# Patient Record
Sex: Female | Born: 1990 | Race: Black or African American | Hispanic: No | Marital: Married | Smoking: Never smoker
Health system: Southern US, Community
[De-identification: ages and names within clinical notes are randomized; demographics above are authoritative.]

## PROBLEM LIST (undated history)

## (undated) DIAGNOSIS — R519 Headache, unspecified: Secondary | ICD-10-CM

## (undated) HISTORY — DX: Headache, unspecified: R51.9

## (undated) HISTORY — PX: NO PAST SURGERIES: SHX2092

---

## 2017-03-25 ENCOUNTER — Encounter (HOSPITAL_COMMUNITY): Payer: Self-pay | Admitting: Emergency Medicine

## 2017-03-25 ENCOUNTER — Emergency Department (HOSPITAL_COMMUNITY)
Admission: EM | Admit: 2017-03-25 | Discharge: 2017-03-25 | Disposition: A | Discharge status: Home / Self Care | Payer: Self-pay | Attending: Emergency Medicine | Admitting: Emergency Medicine

## 2017-03-25 DIAGNOSIS — H5711 Ocular pain, right eye: Secondary | ICD-10-CM | POA: Insufficient documentation

## 2017-03-25 MED ORDER — FLUORESCEIN SODIUM 0.6 MG OP STRP
1.0000 | ORAL_STRIP | Freq: Once | OPHTHALMIC | Status: AC
  Administered 2017-03-25: 1 via OPHTHALMIC
  Filled 2017-03-25: qty 1

## 2017-03-25 MED ORDER — MORPHINE SULFATE (PF) 4 MG/ML IV SOLN: 4.0000 mg | Freq: Once | INTRAVENOUS | Status: DC

## 2017-03-25 MED ORDER — TETRACAINE HCL 0.5 % OP SOLN
2.0000 [drp] | Freq: Once | OPHTHALMIC | Status: AC
  Administered 2017-03-25: 2 [drp] via OPHTHALMIC
  Filled 2017-03-25: qty 8

## 2017-03-25 MED ORDER — IBUPROFEN 400 MG PO TABS
600.0000 mg | ORAL_TABLET | Freq: Once | ORAL | Status: AC
  Administered 2017-03-25: 11:00:00 600 mg via ORAL
  Filled 2017-03-25: qty 1

## 2017-03-25 NOTE — ED Provider Notes (Signed)
Medical screening examination/treatment/procedure(s) were conducted as a shared visit with non-physician practitioner(s) and myself.  I personally evaluated the patient during the encounter. Briefly, the patient is a 26 y.o. female eye pain. Presentation was concerning for uveitis. Discusses with Optho who will see her in the office immediately following ED DC for further evaluation and management.    EKG Interpretation None           Elgin Carn, Amadeo GarnetPedro Eduardo, MD 03/25/17 1108

## 2017-03-25 NOTE — Discharge Instructions (Signed)
Please go to the eye doctor's office when you leave the ED. Have given you the address and phone number.

## 2017-03-25 NOTE — ED Triage Notes (Signed)
Pt c/o 10/10 bilateral eye pain right side more than left, pt states she has allergies and she took benadryl last night but this morning she felt like stones on her eyes. Having blurred vision.

## 2017-03-25 NOTE — ED Provider Notes (Signed)
[facility]-EMERGENCY DEPT Provider Note   CSN: [id number] Arrival date & time: 03/25/17  0544     History   Chief Complaint Chief Complaint  Patient presents with  . Eye Pain    HPI Shelby Orr is a 26 y.o. female.  HPI 26 year old African-American female with no significant past medical history presents to the ED today with complaints of right eye pain and redness. Patient states that 4 days ago she started itching her right eye and thought it was allergies. She noticed some crusting when she woke up in the mornings. She states that she has been taking some Benadryl to help with the itching. However this morning when she woke up she had severe pain in her right eye. States that the lights bother her eyes. She also reports some pain in the left eye as well. Reports blurred vision. She tried taking a Benadryl this morning but had no relief. Light makes the pain worse. Nothing makes the pain better.patient states that "it feels like stones are in my eyes". Denies any associated symptoms of fever, chills, headache, lightheadedness, dizziness, URI symptoms, recent travel. History reviewed. No pertinent past medical history.  There are no active problems to display for this patient.   History reviewed. No pertinent surgical history.  OB History    No data available       Home Medications    Prior to Admission medications   Not on File    Family History History reviewed. No pertinent family history.  Social History Social History  Substance Use Topics  . Smoking status: Never Smoker  . Smokeless tobacco: Never Used  . Alcohol use No     Allergies   Patient has no known allergies.   Review of Systems Review of Systems  Constitutional: Negative for chills and fever.  HENT: Negative for congestion and sore throat.   Eyes: Positive for photophobia, pain, discharge, redness, itching and visual disturbance.  Gastrointestinal: Negative for nausea and vomiting.    Neurological: Negative for dizziness, syncope, weakness, light-headedness, numbness and headaches.     Physical Exam Updated Vital Signs BP (!) 139/104 (BP Location: Left Arm)   Pulse 74   Temp 97.9 F (36.6 C) (Oral)   Resp 18   Ht 5\' 5"  (1.651 m)   Wt 65.8 kg (145 lb)   LMP 03/21/2017   SpO2 100%   BMI 24.13 kg/m   Physical Exam  Constitutional: She appears well-developed and well-nourished. No distress.  HENT:  Head: Normocephalic and atraumatic.  Eyes: Pupils are equal, round, and reactive to light. EOM are normal. Lids are everted and swept, no foreign bodies found. Right eye exhibits no discharge. Left eye exhibits no discharge. Right conjunctiva is injected. Right conjunctiva has no hemorrhage. No scleral icterus.  Fundoscopic exam:      The right eye shows no papilledema.  Slit lamp exam:      The right eye shows no corneal flare.  Consensual photophobia noted. Pupils are equal round and reactive to light. EOMs are intact. No associated periorbital swelling or erythema. No proptosis. Unable to obtain an accurate visual acuity due to patient's pain. Reports blurry vision.  Patient with fluorescent uptake in the cornea with likely associated corneal abrasion. No foreign body noted. Lids were inverted without any foreign body. Leukocytes present on slit-lamp exam. No hyphema.  Left eye is normal.  Neck: Normal range of motion.  Pulmonary/Chest: No respiratory distress.  Musculoskeletal: Normal range of motion.  Neurological: She is alert.  Skin: No pallor.  Psychiatric: Her behavior is normal. Judgment and thought content normal.  Nursing note and vitals reviewed.    ED Treatments / Results  Labs (all labs ordered are listed, but only abnormal results are displayed) Labs Reviewed - No data to display  EKG  EKG Interpretation None       Radiology No results found.  Procedures Procedures (including critical care time)  Medications Ordered in  ED Medications - No data to display   Initial Impression / Assessment and Plan / ED Course  I have reviewed the triage vital signs and the nursing notes.  Pertinent labs & imaging results that were available during my care of the patient were reviewed by me and considered in my medical decision making (see chart for details).     Patient presents to the ED with right eye pain. Unable to obtain accurate visual acuity due to pain. EOMs are intact. Pupils are round equal and reactive to light. Patient does have consensual photophobia. Would slip reveals fluorescein uptake concerning for corneal abrasion on the right cornea. Slit lamp does reveal what appears to be leukocytes. Concern for possible uveitis. Given normal EOMs and no proptosis without any erythema or edema of the surrounding orbits low suspicion for septal or orbital cellulitis. Patient denies any associated symptoms of fever or chills.  Spoke with Dr. Genia DelMincey with ophthalmology who recommends the patient come to their office after discharge from the ED for evaluation. Denies any recommendations at this time.   Pt is hemodynamically stable, in NAD, & able to ambulate in the ED. Evaluation does not show pathology that would require ongoing emergent intervention or inpatient treatment. I explained the diagnosis to the patient. Pain has been managed & has no complaints prior to dc. Pt is comfortable with above plan and is stable for discharge at this time. All questions were answered prior to disposition. Strict return precautions for f/u to the ED were discussed.   Dicussed with Dr. Eudelia Bunchardama who is agreeable to the above plan.   Final Clinical Impressions(s) / ED Diagnoses   Final diagnoses:  Acute right eye pain    New Prescriptions New Prescriptions   No medications on file     Wallace KellerLeaphart, Aaryav Hopfensperger T, PA-C 03/25/17 1118

## 2017-03-25 NOTE — ED Notes (Signed)
Pt pain began about 4 days ago. Pt stated eye was pus/fluid draining yesterday. Today pt cannot open eye. Pt stated that it feels like something is inside or behind eye. Pt is denies any other pain/concernes.

## 2019-04-14 ENCOUNTER — Ambulatory Visit: Payer: Self-pay

## 2019-04-14 ENCOUNTER — Other Ambulatory Visit: Payer: Self-pay

## 2019-04-14 DIAGNOSIS — N912 Amenorrhea, unspecified: Secondary | ICD-10-CM

## 2019-04-14 DIAGNOSIS — Z3201 Encounter for pregnancy test, result positive: Secondary | ICD-10-CM

## 2019-04-14 LAB — POCT URINE PREGNANCY: Preg Test, Ur: POSITIVE — AB

## 2019-04-14 NOTE — Progress Notes (Signed)
Shelby Orr presents today for UPT. She has no unusual complaints and complains of nausea 1 week.Marland Kitchen LMP: 02/12/19    OBJECTIVE: Appears well, in no apparent distress.  OB History   No obstetric history on file.    Home UPT Result: Positive In-Office UPT result: Positive I have reviewed the patient's medical, obstetrical, social, and family histories, and medications.   ASSESSMENT: Positive pregnancy test. Pt is [redacted]w[redacted]d estimated by LMP. EDD 11/19/19  PLAN Prenatal care to be completed at: [place]

## 2019-06-10 ENCOUNTER — Encounter: Payer: Self-pay | Admitting: Obstetrics

## 2019-06-10 DIAGNOSIS — Z3490 Encounter for supervision of normal pregnancy, unspecified, unspecified trimester: Secondary | ICD-10-CM | POA: Insufficient documentation

## 2019-06-11 ENCOUNTER — Ambulatory Visit (INDEPENDENT_AMBULATORY_CARE_PROVIDER_SITE_OTHER): Payer: Medicaid Other | Admitting: Obstetrics

## 2019-06-11 ENCOUNTER — Other Ambulatory Visit: Payer: Self-pay

## 2019-06-11 ENCOUNTER — Other Ambulatory Visit (HOSPITAL_COMMUNITY)
Admission: RE | Admit: 2019-06-11 | Discharge: 2019-06-11 | Disposition: A | Discharge status: Home / Self Care | Payer: Medicaid Other | Source: Ambulatory Visit | Attending: Obstetrics | Admitting: Obstetrics

## 2019-06-11 ENCOUNTER — Encounter: Payer: Self-pay | Admitting: Obstetrics

## 2019-06-11 VITALS — BP 87/56 | HR 60 | Temp 98.2°F | Wt 123.0 lb

## 2019-06-11 DIAGNOSIS — Z3480 Encounter for supervision of other normal pregnancy, unspecified trimester: Secondary | ICD-10-CM

## 2019-06-11 DIAGNOSIS — Z3A17 17 weeks gestation of pregnancy: Secondary | ICD-10-CM

## 2019-06-11 DIAGNOSIS — Z3482 Encounter for supervision of other normal pregnancy, second trimester: Secondary | ICD-10-CM

## 2019-06-11 MED ORDER — BLOOD PRESSURE KIT DEVI: 1.0000 | 0 refills | Status: DC

## 2019-06-11 NOTE — Progress Notes (Addendum)
Subjective:    Shelby Orr is being seen today for her first obstetrical visit.  This is a planned pregnancy. She is at 23w0dgestation. Her obstetrical history is significant for none. Relationship with FOB: spouse, living together. Patient does intend to breast feed. Pregnancy history fully reviewed.  The information documented in the HPI was reviewed and verified.  Menstrual History: OB History    Gravida  4   Para  3   Term  3   Preterm      AB      Living  3     SAB      TAB      Ectopic      Multiple      Live Births  3            Patient's last menstrual period was 02/12/2019.    Past Medical History:  Diagnosis Date  . Headache     History reviewed. No pertinent surgical history.  (Not in a hospital admission)  No Known Allergies  Social History   Tobacco Use  . Smoking status: Never Smoker  . Smokeless tobacco: Never Used  Substance Use Topics  . Alcohol use: No    Family History  Problem Relation Age of Onset  . Stroke Mother   . Hypertension Father   . Diabetes Paternal Grandfather      Review of Systems Constitutional: negative for weight loss Gastrointestinal: negative for vomiting Genitourinary:negative for genital lesions and vaginal discharge and dysuria Musculoskeletal:negative for back pain Behavioral/Psych: negative for abusive relationship, depression, illegal drug usage and tobacco use    Objective:    BP (!) 87/56   Pulse 60   Temp 98.2 F (36.8 C)   Wt 123 lb (55.8 kg)   LMP 02/12/2019   BMI 20.47 kg/m  General Appearance:    Alert, cooperative, no distress, appears stated age  Head:    Normocephalic, without obvious abnormality, atraumatic  Eyes:    PERRL, conjunctiva/corneas clear, EOM's intact, fundi    benign, both eyes  Ears:    Normal TM's and external ear canals, both ears  Nose:   Nares normal, septum midline, mucosa normal, no drainage    or sinus tenderness  Throat:   Lips, mucosa, and tongue  normal; teeth and gums normal  Neck:   Supple, symmetrical, trachea midline, no adenopathy;    thyroid:  no enlargement/tenderness/nodules; no carotid   bruit or JVD  Back:     Symmetric, no curvature, ROM normal, no CVA tenderness  Lungs:     Clear to auscultation bilaterally, respirations unlabored  Chest Wall:    No tenderness or deformity   Heart:    Regular rate and rhythm, S1 and S2 normal, no murmur, rub   or gallop  Breast Exam:    No tenderness, masses, or nipple abnormality  Abdomen:     Soft, non-tender, bowel sounds active all four quadrants,    no masses, no organomegaly  Genitalia:    Normal female without lesion, discharge or tenderness  Extremities:   Extremities normal, atraumatic, no cyanosis or edema  Pulses:   2+ and symmetric all extremities  Skin:   Skin color, texture, turgor normal, no rashes or lesions  Lymph nodes:   Cervical, supraclavicular, and axillary nodes normal  Neurologic:   CNII-XII intact, normal strength, sensation and reflexes    throughout      Lab Review Urine pregnancy test Labs reviewed yes Radiologic studies reviewed yes  Assessment:    Pregnancy at 73w0dweeks    Plan:     1. Supervision of other normal pregnancy, antepartum Rx: - Blood Pressure Monitoring (BLOOD PRESSURE KIT) DEVI; 1 kit by Does not apply route once a week. Check BP regularly.  Large Cuff  DX: O90.0  Dispense: 1 kit; Refill: 0 - Cytology - PAP( [place]) - Cervicovaginal ancillary only( [place]) - Enroll Patient in Babyscripts - Babyscripts Schedule Optimization - Obstetric Panel, Including HIV - Culture, OB Urine - Genetic Screening - UKoreaMFM OB COMP + 190WK; Future  Prenatal vitamins.  Counseling provided regarding continued use of seat belts, cessation of alcohol consumption, smoking or use of illicit drugs; infection precautions i.e., influenza/TDAP immunizations, toxoplasmosis,CMV, parvovirus, listeria and varicella; workplace safety, exercise during  pregnancy; routine dental care, safe medications, sexual activity, hot tubs, saunas, pools, travel, caffeine use, fish and methlymercury, potential toxins, hair treatments, varicose veins Weight gain recommendations per IOM guidelines reviewed: underweight/BMI< 18.5--> gain 28 - 40 lbs; normal weight/BMI 18.5 - 24.9--> gain 25 - 35 lbs; overweight/BMI 25 - 29.9--> gain 15 - 25 lbs; obese/BMI >30->gain  11 - 20 lbs Problem list reviewed and updated. FIRST/CF mutation testing/NIPT/QUAD SCREEN/fragile X/Ashkenazi Jewish population testing/Spinal muscular atrophy discussed: requested. Role of ultrasound in pregnancy discussed; fetal survey: requested. Amniocentesis discussed: not indicated.  Meds ordered this encounter  Medications  . Blood Pressure Monitoring (BLOOD PRESSURE KIT) DEVI    Sig: 1 kit by Does not apply route once a week. Check BP regularly.  Large Cuff  DX: O90.0    Dispense:  1 kit    Refill:  0   Orders Placed This Encounter  Procedures  . Culture, OB Urine  . UKoreaMFM OB COMP + 14 WK    Standing Status:   Future    Standing Expiration Date:   08/10/2020    Order Specific Question:   Reason for Exam (SYMPTOM  OR DIAGNOSIS REQUIRED)    Answer:   Anatomy    Order Specific Question:   Preferred Location    Answer:   [facility] . Obstetric Panel, Including HIV  . Genetic Screening    Follow up in 4 weeks.  50% of 25 min visit spent on counseling and coordination of care. New OB.  Declined FLU vaccine. C/o pain in vagina.  HShelly Bombard MD 06/11/2019 10:40 AM

## 2019-06-12 LAB — OBSTETRIC PANEL, INCLUDING HIV
Antibody Screen: NEGATIVE
Basophils Absolute: 0 10*3/uL (ref 0.0–0.2)
Basos: 0 %
EOS (ABSOLUTE): 0.1 10*3/uL (ref 0.0–0.4)
Eos: 1 %
HIV Screen 4th Generation wRfx: NONREACTIVE
Hematocrit: 32.3 % — ABNORMAL LOW (ref 34.0–46.6)
Hemoglobin: 10.4 g/dL — ABNORMAL LOW (ref 11.1–15.9)
Hepatitis B Surface Ag: NEGATIVE
Immature Grans (Abs): 0 10*3/uL (ref 0.0–0.1)
Immature Granulocytes: 0 %
Lymphocytes Absolute: 2.2 10*3/uL (ref 0.7–3.1)
Lymphs: 23 %
MCH: 28.7 pg (ref 26.6–33.0)
MCHC: 32.2 g/dL (ref 31.5–35.7)
MCV: 89 fL (ref 79–97)
Monocytes Absolute: 0.5 10*3/uL (ref 0.1–0.9)
Monocytes: 5 %
Neutrophils Absolute: 6.6 10*3/uL (ref 1.4–7.0)
Neutrophils: 71 %
Platelets: 275 10*3/uL (ref 150–450)
RBC: 3.63 x10E6/uL — ABNORMAL LOW (ref 3.77–5.28)
RDW: 11.6 % — ABNORMAL LOW (ref 11.7–15.4)
RPR Ser Ql: NONREACTIVE
Rh Factor: POSITIVE
Rubella Antibodies, IGG: 16.5 index (ref >0.99)
WBC: 9.5 10*3/uL (ref 3.4–10.8)

## 2019-06-13 LAB — CULTURE, OB URINE

## 2019-06-13 LAB — URINE CULTURE, OB REFLEX

## 2019-06-14 ENCOUNTER — Other Ambulatory Visit: Payer: Self-pay | Admitting: Obstetrics

## 2019-06-14 DIAGNOSIS — B9689 Other specified bacterial agents as the cause of diseases classified elsewhere: Secondary | ICD-10-CM

## 2019-06-14 DIAGNOSIS — N760 Acute vaginitis: Secondary | ICD-10-CM

## 2019-06-14 LAB — CERVICOVAGINAL ANCILLARY ONLY
Bacterial Vaginitis (gardnerella): POSITIVE — AB
Candida Glabrata: NEGATIVE
Candida Vaginitis: NEGATIVE
Chlamydia: NEGATIVE
Comment: NEGATIVE
Comment: NEGATIVE
Comment: NEGATIVE
Comment: NEGATIVE
Comment: NEGATIVE
Comment: NORMAL
Neisseria Gonorrhea: NEGATIVE
Trichomonas: NEGATIVE

## 2019-06-14 MED ORDER — TINIDAZOLE 500 MG PO TABS: 1000.0000 mg | ORAL_TABLET | Freq: Every day | ORAL | 2 refills | Status: DC

## 2019-06-15 ENCOUNTER — Telehealth: Payer: Self-pay

## 2019-06-15 NOTE — Telephone Encounter (Signed)
-----   Message from Shelly Bombard, MD sent at 06/14/2019  3:48 PM EST ----- Tindamax Rx for BV

## 2019-06-15 NOTE — Telephone Encounter (Signed)
Patient notified of Rx and results.

## 2019-06-16 LAB — CYTOLOGY - PAP
Comment: NEGATIVE
Diagnosis: NEGATIVE
Diagnosis: REACTIVE
High risk HPV: NEGATIVE

## 2019-06-21 MED ORDER — BLOOD PRESSURE KIT DEVI: 1.0000 | 0 refills | Status: DC

## 2019-06-21 NOTE — Addendum Note (Signed)
Addended by: Tamela Oddi on: 06/21/2019 01:30 PM   Modules accepted: Orders

## 2019-06-22 ENCOUNTER — Encounter: Payer: Self-pay | Admitting: Obstetrics

## 2019-06-25 ENCOUNTER — Ambulatory Visit (HOSPITAL_COMMUNITY)
Admission: RE | Admit: 2019-06-25 | Discharge: 2019-06-25 | Disposition: A | Discharge status: Home / Self Care | Payer: Medicaid Other | Source: Ambulatory Visit | Attending: Obstetrics | Admitting: Obstetrics

## 2019-06-25 ENCOUNTER — Other Ambulatory Visit: Payer: Self-pay

## 2019-06-25 DIAGNOSIS — Z3480 Encounter for supervision of other normal pregnancy, unspecified trimester: Secondary | ICD-10-CM | POA: Insufficient documentation

## 2019-06-25 DIAGNOSIS — Z363 Encounter for antenatal screening for malformations: Secondary | ICD-10-CM

## 2019-06-25 DIAGNOSIS — Z3A19 19 weeks gestation of pregnancy: Secondary | ICD-10-CM | POA: Diagnosis not present

## 2019-07-07 ENCOUNTER — Other Ambulatory Visit: Payer: Self-pay | Admitting: *Deleted

## 2019-07-07 MED ORDER — METRONIDAZOLE 500 MG PO TABS: 500.0000 mg | ORAL_TABLET | Freq: Two times a day (BID) | ORAL | 0 refills | Status: DC

## 2019-07-07 NOTE — Progress Notes (Signed)
Change in Rx to Flagyl for ins coverage.

## 2019-07-12 ENCOUNTER — Encounter: Payer: Self-pay | Admitting: Obstetrics

## 2019-07-12 ENCOUNTER — Ambulatory Visit (INDEPENDENT_AMBULATORY_CARE_PROVIDER_SITE_OTHER): Payer: Medicaid Other | Admitting: Obstetrics

## 2019-07-12 ENCOUNTER — Other Ambulatory Visit: Payer: Self-pay

## 2019-07-12 DIAGNOSIS — N898 Other specified noninflammatory disorders of vagina: Secondary | ICD-10-CM

## 2019-07-12 DIAGNOSIS — Z3490 Encounter for supervision of normal pregnancy, unspecified, unspecified trimester: Secondary | ICD-10-CM

## 2019-07-12 DIAGNOSIS — O26892 Other specified pregnancy related conditions, second trimester: Secondary | ICD-10-CM

## 2019-07-12 DIAGNOSIS — Z3A21 21 weeks gestation of pregnancy: Secondary | ICD-10-CM

## 2019-07-12 MED ORDER — TERCONAZOLE 0.4 % VA CREA: 1.0000 | TOPICAL_CREAM | Freq: Every day | VAGINAL | 0 refills | Status: DC

## 2019-07-12 NOTE — Progress Notes (Signed)
TELEHEALTH OBSTETRICS PRENATAL VIRTUAL VIDEO VISIT ENCOUNTER NOTE  Provider location: [facility] [employer] [place]   I connected with Shelby Orr on 07/12/19 at 10:00 AM EST by OB MyChart Video Encounter at home and verified that I am speaking with the correct person using two identifiers.   I discussed the limitations, risks, security and privacy concerns of performing an evaluation and management service virtually and the availability of in person appointments. I also discussed with the patient that there may be a patient responsible charge related to this service. The patient expressed understanding and agreed to proceed. Subjective:  Shelby Orr is a 28 y.o. [id number] at [redacted]w[redacted]d being seen today for ongoing prenatal care.  She is currently monitored for the following issues for this low-risk pregnancy and has Supervision of normal pregnancy, antepartum on their problem list.  Patient reports vaginal irritation.  Contractions: Not present. Vag. Bleeding: None.  Movement: Present. Denies any leaking of fluid.   The following portions of the patient's history were reviewed and updated as appropriate: allergies, current medications, past family history, past medical history, past social history, past surgical history and problem list.   Objective:  There were no vitals filed for this visit.  Fetal Status:     Movement: Present     General:  Alert, oriented and cooperative. Patient is in no acute distress.  Respiratory: Normal respiratory effort, no problems with respiration noted  Mental Status: Normal mood and affect. Normal behavior. Normal judgment and thought content.  Rest of physical exam deferred due to type of encounter  Imaging: [place] Mfm Ob Comp + 14 Wk  Result Date: 06/25/2019 ----------------------------------------------------------------------  OBSTETRICS REPORT                       (Signed Final 06/25/2019 11:46 am)  ---------------------------------------------------------------------- Patient Info  ID #:       [id number]                          D.O.B.:  1990-12-15 (28 yrs)  Name:       Shelby Orr                   Visit Date: 06/25/2019 10:14 am ---------------------------------------------------------------------- Performed By  Performed By:     Percell Boston          Ref. Address:     [address]                                                             [phone]  [place]                                                             [id number]  Attending:        Ma RingsVictor Fang MD         [facility]  [facility] ---------------------------------------------------------------------- Orders   #  Description                          Code         Ordered By   1  US MFM OB COMP + 14 WK               76805.01        ----------------------------------------------------------------------   #  Order #                    Accession #                 Episode #   1  [id number]                  [id number]                  [id number]  ---------------------------------------------------------------------- Indications   Encounter for antenatal screening for          Z36.3   malformations (low risk NIPS, 6.8FF)   [redacted] weeks gestation of pregnancy                Z3A.19  ---------------------------------------------------------------------- Fetal Evaluation  Num Of Fetuses:         1  Cardiac Activity:       Observed  Presentation:           Cephalic  Placenta:               Anterior  P. Cord Insertion:      Visualized, central  Amniotic Fluid  AFI FV:      Within normal limits                              Largest Pocket(cm)                              4.99  ---------------------------------------------------------------------- Biometry  BPD:      45.3  mm     G. Age:  19w 5d         79  %    CI:        75.36   %    70 - 86  FL/HC:      19.2   %    16.1 - 18.3  HC:      165.5  mm     G. Age:  19w 2d         55  %    HC/AC:      1.23        1.09 - 1.39  AC:      134.9  mm     G. Age:  19w 0d         44  %    FL/BPD:     70.0   %  FL:       31.7  mm     G. Age:  19w 6d         74  %    FL/AC:      23.5   %    20 - 24  HUM:      30.1  mm     G. Age:  20w 0d         74  %  CER:      18.8  mm     G. Age:  18w 3d         32  %  CM:        6.3  mm  Est. FW:     289  gm    0 lb 10 oz      68  % ---------------------------------------------------------------------- OB History  Gravidity:    4         Term:   3        Prem:   0        SAB:   0  TOP:          0       Ectopic:  0        Living: 3 ---------------------------------------------------------------------- Gestational Age  LMP:           19w 0d        Date:  02/12/19                 EDD:   11/19/19  U/S Today:     19w 3d                                        EDD:   11/16/19  Best:          19w 0d     Det. By:  LMP  (02/12/19)          EDD:   11/19/19 ---------------------------------------------------------------------- Anatomy  Cranium:               Appears normal         Aortic Arch:            Appears normal  Cavum:                 Appears normal         Ductal Arch:            Appears normal  Ventricles:            Appears normal         Diaphragm:              Appears normal  Choroid Plexus:        Appears normal  Stomach:                Appears normal, left                                                                        sided  Cerebellum:            Appears normal         Abdomen:                Appears normal  Posterior Fossa:       Appears normal         Abdominal Wall:         Appears nml (cord                                                                         insert, abd wall)  Nuchal Fold:           Appears normal         Cord Vessels:           Appears normal (3                                                                        vessel cord)  Face:                  Appears normal         Kidneys:                Appear normal                         (orbits and profile)  Lips:                  Appears normal         Bladder:                Appears normal  Thoracic:              Appears normal         Spine:                  Not well visualized  Heart:                 Appears normal         Upper Extremities:      Appears normal                         (4CH, axis, and  situs)  RVOT:                  Appears normal         Lower Extremities:      Appears normal  LVOT:                  Appears normal  Other:  Fetus appears to be female. Heels visualized. Technically difficult due          to fetal position. ---------------------------------------------------------------------- Cervix Uterus Adnexa  Cervix  Length:            3.3  cm.  Normal appearance by transabdominal scan.  Uterus  No abnormality visualized.  Left Ovary  No adnexal mass visualized.  Right Ovary  No adnexal mass visualized.  Cul De Sac  No free fluid seen.  Adnexa  No abnormality visualized. ---------------------------------------------------------------------- Comments  This patient was seen for a detailed fetal anatomy scan.  She denies any significant past medical history and denies  any problems in her current pregnancy.  She had a cell free DNA test earlier in her pregnancy which  indicated a low risk for trisomy 15, 63, and 13. A female fetus  is predicted.  She was informed that the fetal growth and amniotic fluid  level were appropriate for her gestational age.  There were no obvious fetal anomalies noted on today's  ultrasound exam.  The patient was informed that anomalies may be missed due  to technical limitations. If the fetus is in a suboptimal  position  or maternal habitus is increased, visualization of the fetus in  the maternal uterus may be impaired.  Follow up as indicated. ----------------------------------------------------------------------                   Johnell Comings, MD Electronically Signed Final Report   06/25/2019 11:46 am ----------------------------------------------------------------------   Assessment and Plan:  Pregnancy: [id number] at [redacted]w[redacted]d 1. Encounter for supervision of normal pregnancy, antepartum, unspecified gravidity  2. Vaginal irritation Rx: - terconazole (TERAZOL 7) 0.4 % vaginal cream; Place 1 applicator vaginally at bedtime.  Dispense: 45 g; Refill: 0  Preterm labor symptoms and general obstetric precautions including but not limited to vaginal bleeding, contractions, leaking of fluid and fetal movement were reviewed in detail with the patient. I discussed the assessment and treatment plan with the patient. The patient was provided an opportunity to ask questions and all were answered. The patient agreed with the plan and demonstrated an understanding of the instructions. The patient was advised to call back or seek an in-person office evaluation/go to [facility]U at [facility] for any urgent or concerning symptoms. Please refer to After Visit Summary for other counseling recommendations.   I provided 10 minutes of face-to-face time during this encounter.  Return in about 4 weeks (around 08/09/2019) for WebEx.    Baltazar Najjar, MD [facility] [place] [facility] 07/12/2019

## 2019-07-12 NOTE — Progress Notes (Signed)
Virtual ROB   B/P: 90/58 P:78  CC: NONE

## 2019-07-21 ENCOUNTER — Encounter: Payer: Self-pay | Admitting: Obstetrics

## 2019-08-09 ENCOUNTER — Encounter: Payer: Medicaid Other | Admitting: Advanced Practice Midwife

## 2019-08-09 ENCOUNTER — Telehealth (INDEPENDENT_AMBULATORY_CARE_PROVIDER_SITE_OTHER): Payer: Medicaid Other | Admitting: Advanced Practice Midwife

## 2019-08-09 DIAGNOSIS — Z3490 Encounter for supervision of normal pregnancy, unspecified, unspecified trimester: Secondary | ICD-10-CM

## 2019-08-09 DIAGNOSIS — Z3A25 25 weeks gestation of pregnancy: Secondary | ICD-10-CM

## 2019-08-09 DIAGNOSIS — Z3492 Encounter for supervision of normal pregnancy, unspecified, second trimester: Secondary | ICD-10-CM

## 2019-08-09 NOTE — Progress Notes (Signed)
Pt presents for webex visit. Pt identified with 2 patient identifiers. Pt unable to check bp at this time. She has no concerns.

## 2019-08-09 NOTE — Progress Notes (Signed)
   [place] VIRTUAL VIDEO VISIT ENCOUNTER NOTE  Provider location: [facility] for [facility] at [place]   I connected with Shelby Orr on 08/10/19 at  2:55 PM EST by MyChart Video Encounter at home and verified that I am speaking with the correct person using two identifiers.   I discussed the limitations, risks, security and privacy concerns of performing an evaluation and management service virtually and the availability of in person appointments. I also discussed with the patient that there may be a patient responsible charge related to this service. The patient expressed understanding and agreed to proceed. Subjective:  Shelby Orr is a 28 y.o. [id number] at [redacted]w[redacted]d being seen today for ongoing prenatal care.  She is currently monitored for the following issues for this low-risk pregnancy and has Supervision of normal pregnancy, antepartum on their problem list.  Patient reports no complaints.  Contractions: Not present. Vag. Bleeding: None.  Movement: Present. Denies any leaking of fluid.   The following portions of the patient's history were reviewed and updated as appropriate: allergies, current medications, past family history, past medical history, past social history, past surgical history and problem list.   Objective:  There were no vitals filed for this visit.  Fetal Status:     Movement: Present     General:  Alert, oriented and cooperative. Patient is in no acute distress.  Respiratory: Normal respiratory effort, no problems with respiration noted  Mental Status: Normal mood and affect. Normal behavior. Normal judgment and thought content.  Rest of physical exam deferred due to type of encounter  Imaging: No results found.  Assessment and Plan:  Pregnancy: G4P3003 at [redacted]w[redacted]d 1. Encounter for supervision of normal pregnancy, antepartum, unspecified gravidity --Anticipatory guidance about next visits/weeks of pregnancy given. --Next visit in 3 weeks  in person for GTT --Pt needs her mother to come from [place] at time of delivery to help with her other children and with new baby. Visa's are difficult now due to COVID. Letter written recommending pt mother be allowed to come support her.    Preterm labor symptoms and general obstetric precautions including but not limited to vaginal bleeding, contractions, leaking of fluid and fetal movement were reviewed in detail with the patient. I discussed the assessment and treatment plan with the patient. The patient was provided an opportunity to ask questions and all were answered. The patient agreed with the plan and demonstrated an understanding of the instructions. The patient was advised to call back or seek an in-person office evaluation/go to MAU at [facility] for any urgent or concerning symptoms. Please refer to After Visit Summary for other counseling recommendations.   I provided 10 minutes of face-to-face time during this encounter.  Return in about 3 weeks (around 08/30/2019).  No future appointments.  Fatima Blank, [place] [facility], [place]

## 2019-08-13 NOTE — L&D Delivery Note (Addendum)
OB/GYN Faculty Practice Delivery Note  Shelby Orr is a 29 y.o. [id number] s/p VD at [redacted]w[redacted]d. She was admitted for SOL.   ROM: 0h 72m with clear fluid GBS Status:  Negative/-- (03/15 0958) Maximum Maternal Temperature: 98.3 F   Labor Progress: Patient presented to L&D for SOL.  Initial SVE: 4.5 / 70 / -2  and received pitocin. Had SROM. She then progressed to complete.   Delivery Date/Time: 11/14/19 at 0915  Delivery: Called to room and patient was complete and pushing. Head delivered LOA. Nuchal cord x1 and body cord not reduced, baby delivered through . Shoulder and body delivered in usual fashion. Infant with spontaneous cry, placed on mother's abdomen, dried and stimulated. Cord clamped x 2 after 1-minute delay, and cut by RN. Cord blood drawn. Placenta delivered spontaneously with gentle cord traction. Fundus firm with massage and Pitocin. Labia, perineum, vagina, and cervix inspected inspected and was without lacerations..  Baby Weight: pending   Placenta: Sent to L&D  Complications: None Lacerations: None  EBL: 200 mL  Analgesia: Epidural   Infant: APGAR (1 MIN):  8 APGAR (5 MINS):  9 APGAR (10 MINS):     Katha Cabal, DO PGY-1, [facility] 11/14/2019 9:31 AM     Attestation of Supervision of Student:  I confirm that I have verified the information documented in the  resident's  note and that I have also personally reperformed the history, physical exam and all medical decision making activities.  I have verified that all services and findings are accurately documented in this student's note; and I agree with management and plan as outlined in the documentation. I have also made any necessary editorial changes.  I was present and gloved with the resident for the entire delivery and I agree with the above note.   Patient decided after the delivery that she would prefer to wait for the IUD and have it done in the office rather than  having it done here.    Thressa Sheller DNP, CNM  11/14/19  9:51 AM

## 2019-08-31 ENCOUNTER — Other Ambulatory Visit: Payer: Medicaid Other

## 2019-08-31 ENCOUNTER — Other Ambulatory Visit: Payer: Self-pay

## 2019-08-31 ENCOUNTER — Ambulatory Visit (INDEPENDENT_AMBULATORY_CARE_PROVIDER_SITE_OTHER): Payer: Medicaid Other | Admitting: Advanced Practice Midwife

## 2019-08-31 DIAGNOSIS — Z3A28 28 weeks gestation of pregnancy: Secondary | ICD-10-CM

## 2019-08-31 DIAGNOSIS — Z3493 Encounter for supervision of normal pregnancy, unspecified, third trimester: Secondary | ICD-10-CM

## 2019-08-31 DIAGNOSIS — Z3490 Encounter for supervision of normal pregnancy, unspecified, unspecified trimester: Secondary | ICD-10-CM

## 2019-08-31 NOTE — Patient Instructions (Signed)
Third Trimester of Pregnancy The third trimester is from week 28 through week 40 (months 7 through 9). The third trimester is a time when the unborn baby (fetus) is growing rapidly. At the end of the ninth month, the fetus is about 20 inches in length and weighs 6-10 pounds. Body changes during your third trimester Your body will continue to go through many changes during pregnancy. The changes vary from woman to woman. During the third trimester:  Your weight will continue to increase. You can expect to gain 25-35 pounds (11-16 kg) by the end of the pregnancy.  You may begin to get stretch marks on your hips, abdomen, and breasts.  You may urinate more often because the fetus is moving lower into your pelvis and pressing on your bladder.  You may develop or continue to have heartburn. This is caused by increased hormones that slow down muscles in the digestive tract.  You may develop or continue to have constipation because increased hormones slow digestion and cause the muscles that push waste through your intestines to relax.  You may develop hemorrhoids. These are swollen veins (varicose veins) in the rectum that can itch or be painful.  You may develop swollen, bulging veins (varicose veins) in your legs.  You may have increased body aches in the pelvis, back, or thighs. This is due to weight gain and increased hormones that are relaxing your joints.  You may have changes in your hair. These can include thickening of your hair, rapid growth, and changes in texture. Some women also have hair loss during or after pregnancy, or hair that feels dry or thin. Your hair will most likely return to normal after your baby is born.  Your breasts will continue to grow and they will continue to become tender. A yellow fluid (colostrum) may leak from your breasts. This is the first milk you are producing for your baby.  Your belly button may stick out.  You may notice more swelling in your hands,  face, or ankles.  You may have increased tingling or numbness in your hands, arms, and legs. The skin on your belly may also feel numb.  You may feel short of breath because of your expanding uterus.  You may have more problems sleeping. This can be caused by the size of your belly, increased need to urinate, and an increase in your body's metabolism.  You may notice the fetus "dropping," or moving lower in your abdomen (lightening).  You may have increased vaginal discharge.  You may notice your joints feel loose and you may have pain around your pelvic bone. What to expect at prenatal visits You will have prenatal exams every 2 weeks until week 36. Then you will have weekly prenatal exams. During a routine prenatal visit:  You will be weighed to make sure you and the baby are growing normally.  Your blood pressure will be taken.  Your abdomen will be measured to track your baby's growth.  The fetal heartbeat will be listened to.  Any test results from the previous visit will be discussed.  You may have a cervical check near your due date to see if your cervix has softened or thinned (effaced).  You will be tested for Group B streptococcus. This happens between 35 and 37 weeks. Your health care provider may ask you:  What your birth plan is.  How you are feeling.  If you are feeling the baby move.  If you have had any abnormal   symptoms, such as leaking fluid, bleeding, severe headaches, or abdominal cramping.  If you are using any tobacco products, including cigarettes, chewing tobacco, and electronic cigarettes.  If you have any questions. Other tests or screenings that may be performed during your third trimester include:  Blood tests that check for low iron levels (anemia).  Fetal testing to check the health, activity level, and growth of the fetus. Testing is done if you have certain medical conditions or if there are problems during the pregnancy.  Nonstress test  (NST). This test checks the health of your baby to make sure there are no signs of problems, such as the baby not getting enough oxygen. During this test, a belt is placed around your belly. The baby is made to move, and its heart rate is monitored during movement. What is false labor? False labor is a condition in which you feel small, irregular tightenings of the muscles in the womb (contractions) that usually go away with rest, changing position, or drinking water. These are called Braxton Hicks contractions. Contractions may last for hours, days, or even weeks before true labor sets in. If contractions come at regular intervals, become more frequent, increase in intensity, or become painful, you should see your health care provider. What are the signs of labor?  Abdominal cramps.  Regular contractions that start at 10 minutes apart and become stronger and more frequent with time.  Contractions that start on the top of the uterus and spread down to the lower abdomen and back.  Increased pelvic pressure and dull back pain.  A watery or bloody mucus discharge that comes from the vagina.  Leaking of amniotic fluid. This is also known as your "water breaking." It could be a slow trickle or a gush. Let your health care provider know if it has a color or strange odor. If you have any of these signs, call your health care provider right away, even if it is before your due date. Follow these instructions at home: Medicines  Follow your health care provider's instructions regarding medicine use. Specific medicines may be either safe or unsafe to take during pregnancy.  Take a prenatal vitamin that contains at least 600 micrograms (mcg) of folic acid.  If you develop constipation, try taking a stool softener if your health care provider approves. Eating and drinking   Eat a balanced diet that includes fresh fruits and vegetables, whole grains, good sources of protein such as meat, eggs, or tofu,  and low-fat dairy. Your health care provider will help you determine the amount of weight gain that is right for you.  Avoid raw meat and uncooked cheese. These carry germs that can cause birth defects in the baby.  If you have low calcium intake from food, talk to your health care provider about whether you should take a daily calcium supplement.  Eat four or five small meals rather than three large meals a day.  Limit foods that are high in fat and processed sugars, such as fried and sweet foods.  To prevent constipation: ? Drink enough fluid to keep your urine clear or pale yellow. ? Eat foods that are high in fiber, such as fresh fruits and vegetables, whole grains, and beans. Activity  Exercise only as directed by your health care provider. Most women can continue their usual exercise routine during pregnancy. Try to exercise for 30 minutes at least 5 days a week. Stop exercising if you experience uterine contractions.  Avoid heavy lifting.  Do   not exercise in extreme heat or humidity, or at high altitudes.  Wear low-heel, comfortable shoes.  Practice good posture.  You may continue to have sex unless your health care provider tells you otherwise. Relieving pain and discomfort  Take frequent breaks and rest with your legs elevated if you have leg cramps or low back pain.  Take warm sitz baths to soothe any pain or discomfort caused by hemorrhoids. Use hemorrhoid cream if your health care provider approves.  Wear a good support bra to prevent discomfort from breast tenderness.  If you develop varicose veins: ? Wear support pantyhose or compression stockings as told by your healthcare provider. ? Elevate your feet for 15 minutes, 3-4 times a day. Prenatal care  Write down your questions. Take them to your prenatal visits.  Keep all your prenatal visits as told by your health care provider. This is important. Safety  Wear your seat belt at all times when driving.  Make  a list of emergency phone numbers, including numbers for family, friends, the hospital, and police and fire departments. General instructions  Avoid cat litter boxes and soil used by cats. These carry germs that can cause birth defects in the baby. If you have a cat, ask someone to clean the litter box for you.  Do not travel far distances unless it is absolutely necessary and only with the approval of your health care provider.  Do not use hot tubs, steam rooms, or saunas.  Do not drink alcohol.  Do not use any products that contain nicotine or tobacco, such as cigarettes and e-cigarettes. If you need help quitting, ask your health care provider.  Do not use any medicinal herbs or unprescribed drugs. These chemicals affect the formation and growth of the baby.  Do not douche or use tampons or scented sanitary pads.  Do not cross your legs for long periods of time.  To prepare for the arrival of your baby: ? Take prenatal classes to understand, practice, and ask questions about labor and delivery. ? Make a trial run to the hospital. ? Visit the hospital and tour the maternity area. ? Arrange for maternity or paternity leave through employers. ? Arrange for family and friends to take care of pets while you are in the hospital. ? Purchase a rear-facing car seat and make sure you know how to install it in your car. ? Pack your hospital bag. ? Prepare the baby's nursery. Make sure to remove all pillows and stuffed animals from the baby's crib to prevent suffocation.  Visit your dentist if you have not gone during your pregnancy. Use a soft toothbrush to brush your teeth and be gentle when you floss. Contact a health care provider if:  You are unsure if you are in labor or if your water has broken.  You become dizzy.  You have mild pelvic cramps, pelvic pressure, or nagging pain in your abdominal area.  You have lower back pain.  You have persistent nausea, vomiting, or  diarrhea.  You have an unusual or bad smelling vaginal discharge.  You have pain when you urinate. Get help right away if:  Your water breaks before 37 weeks.  You have regular contractions less than 5 minutes apart before 37 weeks.  You have a fever.  You are leaking fluid from your vagina.  You have spotting or bleeding from your vagina.  You have severe abdominal pain or cramping.  You have rapid weight loss or weight gain.  You have   shortness of breath with chest pain.  You notice sudden or extreme swelling of your face, hands, ankles, feet, or legs.  Your baby makes fewer than 10 movements in 2 hours.  You have severe headaches that do not go away when you take medicine.  You have vision changes. Summary  The third trimester is from week 28 through week 40, months 7 through 9. The third trimester is a time when the unborn baby (fetus) is growing rapidly.  During the third trimester, your discomfort may increase as you and your baby continue to gain weight. You may have abdominal, leg, and back pain, sleeping problems, and an increased need to urinate.  During the third trimester your breasts will keep growing and they will continue to become tender. A yellow fluid (colostrum) may leak from your breasts. This is the first milk you are producing for your baby.  False labor is a condition in which you feel small, irregular tightenings of the muscles in the womb (contractions) that eventually go away. These are called Braxton Hicks contractions. Contractions may last for hours, days, or even weeks before true labor sets in.  Signs of labor can include: abdominal cramps; regular contractions that start at 10 minutes apart and become stronger and more frequent with time; watery or bloody mucus discharge that comes from the vagina; increased pelvic pressure and dull back pain; and leaking of amniotic fluid. This information is not intended to replace advice given to you by your  health care provider. Make sure you discuss any questions you have with your health care provider. Document Revised: 11/19/2018 Document Reviewed: 09/03/2016 Elsevier Patient Education  2020 Elsevier Inc.  

## 2019-08-31 NOTE — Progress Notes (Addendum)
   PRENATAL VISIT NOTE  Subjective:  Kea Callan is a 29 y.o. A5W0981 at [redacted]w[redacted]d being seen today for ongoing prenatal care.  She is currently monitored for the following issues for this low-risk pregnancy and has Supervision of normal pregnancy, antepartum on their problem list.  Patient reports no complaints.  Contractions: Not present. Vag. Bleeding: None.  Movement: Present. Denies leaking of fluid.   The following portions of the patient's history were reviewed and updated as appropriate: allergies, current medications, past family history, past medical history, past social history, past surgical history and problem list.   Objective:   Vitals:   08/31/19 0856  BP: (!) 91/56  Pulse: 70  Weight: 137 lb (62.1 kg)    Fetal Status: Fetal Heart Rate (bpm): 139 Fundal Height: 28 cm Movement: Present     General:  Alert, oriented and cooperative. Patient is in no acute distress.  Skin: Skin is warm and dry. No rash noted.   Cardiovascular: Normal heart rate noted  Respiratory: Normal respiratory effort, no problems with respiration noted  Abdomen: Soft, gravid, appropriate for gestational age.  Pain/Pressure: Absent     Pelvic: Cervical exam deferred        Extremities: Normal range of motion.  Edema: None  Mental Status: Normal mood and affect. Normal behavior. Normal judgment and thought content.   Assessment and Plan:  Pregnancy: G4P3003 at [redacted]w[redacted]d 1. Encounter for supervision of normal pregnancy, antepartum, unspecified gravidity --Anticipatory guidance about next visits/weeks of pregnancy given. --Next visit at 32 weeks, virtual - Glucose Tolerance, 2 Hours w/1 Hour - RPR - CBC - HIV antibody (with reflex)  Preterm labor symptoms and general obstetric precautions including but not limited to vaginal bleeding, contractions, leaking of fluid and fetal movement were reviewed in detail with the patient. Please refer to After Visit Summary for other counseling recommendations.    Return in about 4 weeks (around 09/28/2019).  No future appointments.  Sharen Counter, CNM

## 2019-08-31 NOTE — Addendum Note (Signed)
Addended by: Sharen Counter A on: 08/31/2019 10:07 AM   Modules accepted: Orders

## 2019-08-31 NOTE — Progress Notes (Signed)
ROB/GTT.  TDAP Declined

## 2019-09-01 LAB — GLUCOSE TOLERANCE, 2 HOURS W/ 1HR
Glucose, 1 hour: 114 mg/dL (ref 65–179)
Glucose, 2 hour: 91 mg/dL (ref 65–152)
Glucose, Fasting: 71 mg/dL (ref 65–91)

## 2019-09-01 LAB — HIV ANTIBODY (ROUTINE TESTING W REFLEX): HIV Screen 4th Generation wRfx: NONREACTIVE

## 2019-09-01 LAB — CBC
Hematocrit: 32 % — ABNORMAL LOW (ref 34.0–46.6)
Hemoglobin: 10.4 g/dL — ABNORMAL LOW (ref 11.1–15.9)
MCH: 29.2 pg (ref 26.6–33.0)
MCHC: 32.5 g/dL (ref 31.5–35.7)
MCV: 90 fL (ref 79–97)
Platelets: 267 10*3/uL (ref 150–450)
RBC: 3.56 x10E6/uL — ABNORMAL LOW (ref 3.77–5.28)
RDW: 11 % — ABNORMAL LOW (ref 11.7–15.4)
WBC: 9.6 10*3/uL (ref 3.4–10.8)

## 2019-09-01 LAB — RPR: RPR Ser Ql: NONREACTIVE

## 2019-09-28 ENCOUNTER — Telehealth (INDEPENDENT_AMBULATORY_CARE_PROVIDER_SITE_OTHER): Payer: Medicaid Other | Admitting: Advanced Practice Midwife

## 2019-09-28 ENCOUNTER — Encounter: Payer: Self-pay | Admitting: Advanced Practice Midwife

## 2019-09-28 VITALS — BP 103/71 | HR 82

## 2019-09-28 DIAGNOSIS — R102 Pelvic and perineal pain: Secondary | ICD-10-CM | POA: Diagnosis not present

## 2019-09-28 DIAGNOSIS — Z3490 Encounter for supervision of normal pregnancy, unspecified, unspecified trimester: Secondary | ICD-10-CM

## 2019-09-28 DIAGNOSIS — O26893 Other specified pregnancy related conditions, third trimester: Secondary | ICD-10-CM | POA: Diagnosis not present

## 2019-09-28 DIAGNOSIS — Z3A32 32 weeks gestation of pregnancy: Secondary | ICD-10-CM | POA: Diagnosis not present

## 2019-09-28 MED ORDER — COMFORT FIT MATERNITY SUPP MED MISC
1.0000 | Freq: Every day | 0 refills | Status: DC
Start: 2019-09-28 — End: 2019-11-16

## 2019-09-28 NOTE — Progress Notes (Signed)
   TELEHEALTH OBSTETRICS PRENATAL VIRTUAL VIDEO VISIT ENCOUNTER NOTE  Provider location: [facility] at [place]   I connected with Shelby Orr on 09/28/19 at  8:55 AM EST by MyChart Video Encounter at home and verified that I am speaking with the correct person using two identifiers.   I discussed the limitations, risks, security and privacy concerns of performing an evaluation and management service virtually and the availability of in person appointments. I also discussed with the patient that there may be a patient responsible charge related to this service. The patient expressed understanding and agreed to proceed. Subjective:  Shelby Orr is a 29 y.o. O1B5102 at [redacted]w[redacted]d being seen today for ongoing prenatal care.  She is currently monitored for the following issues for this low-risk pregnancy and has Supervision of normal pregnancy, antepartum on their problem list.  Patient reports pelvic pain.  Contractions: Not present. Vag. Bleeding: None.  Movement: Present. Denies any leaking of fluid.   The following portions of the patient's history were reviewed and updated as appropriate: allergies, current medications, past family history, past medical history, past social history, past surgical history and problem list.   Objective:   Vitals:   09/28/19 0839  BP: 103/71  Pulse: 82    Fetal Status:     Movement: Present     General:  Alert, oriented and cooperative. Patient is in no acute distress.  Respiratory: Normal respiratory effort, no problems with respiration noted  Mental Status: Normal mood and affect. Normal behavior. Normal judgment and thought content.  Rest of physical exam deferred due to type of encounter  Imaging: No results found.  Assessment and Plan:  Pregnancy: G4P3003 at [redacted]w[redacted]d 1. Encounter for supervision of normal pregnancy, antepartum, unspecified gravidity --Pt reports good fetal movement, denies cramping, LOF, or vaginal  bleeding --Anticipatory guidance about next visits/weeks of pregnancy given.   2. Pelvic pain affecting pregnancy in third trimester, antepartum --Pain when she has to urinate, and pain/pressure with standing --Pt to come in to give urine sample this week for UA and culture --Rest/ice/heat/warm bath/Tylenol/pregnancy support belt --F/U in 2 weeks virtual  - Elastic Bandages & Supports (COMFORT FIT MATERNITY SUPP MED) MISC; 1 Device by Does not apply route daily.  Dispense: 1 each; Refill: 0  Preterm labor symptoms and general obstetric precautions including but not limited to vaginal bleeding, contractions, leaking of fluid and fetal movement were reviewed in detail with the patient. I discussed the assessment and treatment plan with the patient. The patient was provided an opportunity to ask questions and all were answered. The patient agreed with the plan and demonstrated an understanding of the instructions. The patient was advised to call back or seek an in-person office evaluation/go to MAU at [facility] for any urgent or concerning symptoms. Please refer to After Visit Summary for other counseling recommendations.   I provided 10 minutes of face-to-face time during this encounter.  Return in about 2 weeks (around 10/12/2019).  Future Appointments  Date Time Provider Department Center  10/12/2019  9:55 AM [place], Wil[place] Floor, CNM [facility] None    Sharen Counter, CNM [facility], [facility]

## 2019-09-28 NOTE — Progress Notes (Signed)
Virtual Visit via Telephone Note  I connected with Shelby Orr on 09/28/19 at  8:55 AM EST by telephone and verified that I am speaking with the correct person using two identifiers.  No concerns per pt

## 2019-10-01 ENCOUNTER — Other Ambulatory Visit: Payer: Self-pay

## 2019-10-01 ENCOUNTER — Other Ambulatory Visit: Payer: Medicaid Other

## 2019-10-12 ENCOUNTER — Telehealth (INDEPENDENT_AMBULATORY_CARE_PROVIDER_SITE_OTHER): Payer: Medicaid Other | Admitting: Advanced Practice Midwife

## 2019-10-12 VITALS — BP 103/66 | HR 74

## 2019-10-12 DIAGNOSIS — Z3A34 34 weeks gestation of pregnancy: Secondary | ICD-10-CM

## 2019-10-12 DIAGNOSIS — Z3490 Encounter for supervision of normal pregnancy, unspecified, unspecified trimester: Secondary | ICD-10-CM

## 2019-10-12 DIAGNOSIS — R102 Pelvic and perineal pain: Secondary | ICD-10-CM

## 2019-10-12 DIAGNOSIS — O26893 Other specified pregnancy related conditions, third trimester: Secondary | ICD-10-CM

## 2019-10-12 NOTE — Progress Notes (Signed)
   TELEHEALTH OBSTETRICS PRENATAL VIRTUAL VIDEO VISIT ENCOUNTER NOTE  Provider location: [facility] [place]   I connected with Shelby Orr on 10/12/19 at  9:55 AM EST by MyChart Video Encounter at home and verified that I am speaking with the correct person using two identifiers.   I discussed the limitations, risks, security and privacy concerns of performing an evaluation and management service virtually and the availability of in person appointments. I also discussed with the patient that there may be a patient responsible charge related to this service. The patient expressed understanding and agreed to proceed. Subjective:  Shelby Orr is a 29 y.o. G8Q7619 at [redacted]w[redacted]d being seen today for ongoing prenatal care.  She is currently monitored for the following issues for this low-risk pregnancy and has Supervision of normal pregnancy, antepartum on their problem list.  Patient reports intermittent pelvic pressure.  Contractions: Irregular. Vag. Bleeding: None.  Movement: Present. Denies any leaking of fluid.   The following portions of the patient's history were reviewed and updated as appropriate: allergies, current medications, past family history, past medical history, past social history, past surgical history and problem list.   Objective:   Vitals:   10/12/19 0926  BP: 103/66  Pulse: 74    Fetal Status:     Movement: Present     General:  Alert, oriented and cooperative. Patient is in no acute distress.  Respiratory: Normal respiratory effort, no problems with respiration noted  Mental Status: Normal mood and affect. Normal behavior. Normal judgment and thought content.  Rest of physical exam deferred due to type of encounter  Imaging: No results found.  Assessment and Plan:  Pregnancy: G4P3003 at [redacted]w[redacted]d   1. Encounter for supervision of normal pregnancy, antepartum, unspecified gravidity --Pt reports good fetal movement, denies cramping, LOF, or  vaginal bleeding --Anticipatory guidance about next visits/weeks of pregnancy given. --Return at 36 weeks for in office visit for GBS/GC  2. Pelvic pain affecting pregnancy in third trimester, antepartum --Pressure/pain when standing or sitting too long.  Improved with pregnancy support belt.   --Pt left urine 2/16 in office, not resulted.  No urinary symptoms.  Will look for results and recheck urine at 36 week visit.  Preterm labor symptoms and general obstetric precautions including but not limited to vaginal bleeding, contractions, leaking of fluid and fetal movement were reviewed in detail with the patient. I discussed the assessment and treatment plan with the patient. The patient was provided an opportunity to ask questions and all were answered. The patient agreed with the plan and demonstrated an understanding of the instructions. The patient was advised to call back or seek an in-person office evaluation/go to MAU at [facility] for any urgent or concerning symptoms. Please refer to After Visit Summary for other counseling recommendations.   I provided 10 minutes of face-to-face time during this encounter.  No follow-ups on file.  Future Appointments  Date Time Provider Department Center  10/12/2019  9:55 AM Leftwich-Kirby, Wilmer Floor, CNM [facility] None    Sharen Counter, CNM [facility], [facility]

## 2019-10-25 ENCOUNTER — Other Ambulatory Visit (HOSPITAL_COMMUNITY)
Admission: RE | Admit: 2019-10-25 | Discharge: 2019-10-25 | Disposition: A | Discharge status: Home / Self Care | Payer: Medicaid Other | Source: Ambulatory Visit | Attending: Advanced Practice Midwife | Admitting: Advanced Practice Midwife

## 2019-10-25 ENCOUNTER — Ambulatory Visit (INDEPENDENT_AMBULATORY_CARE_PROVIDER_SITE_OTHER): Payer: Medicaid Other | Admitting: Advanced Practice Midwife

## 2019-10-25 ENCOUNTER — Other Ambulatory Visit: Payer: Self-pay

## 2019-10-25 VITALS — BP 105/90 | HR 74 | Wt 144.4 lb

## 2019-10-25 DIAGNOSIS — Z3490 Encounter for supervision of normal pregnancy, unspecified, unspecified trimester: Secondary | ICD-10-CM | POA: Insufficient documentation

## 2019-10-25 DIAGNOSIS — O26893 Other specified pregnancy related conditions, third trimester: Secondary | ICD-10-CM

## 2019-10-25 DIAGNOSIS — R102 Pelvic and perineal pain: Secondary | ICD-10-CM

## 2019-10-25 DIAGNOSIS — Z3A36 36 weeks gestation of pregnancy: Secondary | ICD-10-CM

## 2019-10-25 NOTE — Patient Instructions (Signed)
Third Trimester of Pregnancy The third trimester is from week 28 through week 40 (months 7 through 9). The third trimester is a time when the unborn baby (fetus) is growing rapidly. At the end of the ninth month, the fetus is about 20 inches in length and weighs 6-10 pounds. Body changes during your third trimester Your body will continue to go through many changes during pregnancy. The changes vary from woman to woman. During the third trimester:  Your weight will continue to increase. You can expect to gain 25-35 pounds (11-16 kg) by the end of the pregnancy.  You may begin to get stretch marks on your hips, abdomen, and breasts.  You may urinate more often because the fetus is moving lower into your pelvis and pressing on your bladder.  You may develop or continue to have heartburn. This is caused by increased hormones that slow down muscles in the digestive tract.  You may develop or continue to have constipation because increased hormones slow digestion and cause the muscles that push waste through your intestines to relax.  You may develop hemorrhoids. These are swollen veins (varicose veins) in the rectum that can itch or be painful.  You may develop swollen, bulging veins (varicose veins) in your legs.  You may have increased body aches in the pelvis, back, or thighs. This is due to weight gain and increased hormones that are relaxing your joints.  You may have changes in your hair. These can include thickening of your hair, rapid growth, and changes in texture. Some women also have hair loss during or after pregnancy, or hair that feels dry or thin. Your hair will most likely return to normal after your baby is born.  Your breasts will continue to grow and they will continue to become tender. A yellow fluid (colostrum) may leak from your breasts. This is the first milk you are producing for your baby.  Your belly button may stick out.  You may notice more swelling in your hands,  face, or ankles.  You may have increased tingling or numbness in your hands, arms, and legs. The skin on your belly may also feel numb.  You may feel short of breath because of your expanding uterus.  You may have more problems sleeping. This can be caused by the size of your belly, increased need to urinate, and an increase in your body's metabolism.  You may notice the fetus "dropping," or moving lower in your abdomen (lightening).  You may have increased vaginal discharge.  You may notice your joints feel loose and you may have pain around your pelvic bone. What to expect at prenatal visits You will have prenatal exams every 2 weeks until week 36. Then you will have weekly prenatal exams. During a routine prenatal visit:  You will be weighed to make sure you and the baby are growing normally.  Your blood pressure will be taken.  Your abdomen will be measured to track your baby's growth.  The fetal heartbeat will be listened to.  Any test results from the previous visit will be discussed.  You may have a cervical check near your due date to see if your cervix has softened or thinned (effaced).  You will be tested for Group B streptococcus. This happens between 35 and 37 weeks. Your health care provider may ask you:  What your birth plan is.  How you are feeling.  If you are feeling the baby move.  If you have had any abnormal   symptoms, such as leaking fluid, bleeding, severe headaches, or abdominal cramping.  If you are using any tobacco products, including cigarettes, chewing tobacco, and electronic cigarettes.  If you have any questions. Other tests or screenings that may be performed during your third trimester include:  Blood tests that check for low iron levels (anemia).  Fetal testing to check the health, activity level, and growth of the fetus. Testing is done if you have certain medical conditions or if there are problems during the pregnancy.  Nonstress test  (NST). This test checks the health of your baby to make sure there are no signs of problems, such as the baby not getting enough oxygen. During this test, a belt is placed around your belly. The baby is made to move, and its heart rate is monitored during movement. What is false labor? False labor is a condition in which you feel small, irregular tightenings of the muscles in the womb (contractions) that usually go away with rest, changing position, or drinking water. These are called Braxton Hicks contractions. Contractions may last for hours, days, or even weeks before true labor sets in. If contractions come at regular intervals, become more frequent, increase in intensity, or become painful, you should see your health care provider. What are the signs of labor?  Abdominal cramps.  Regular contractions that start at 10 minutes apart and become stronger and more frequent with time.  Contractions that start on the top of the uterus and spread down to the lower abdomen and back.  Increased pelvic pressure and dull back pain.  A watery or bloody mucus discharge that comes from the vagina.  Leaking of amniotic fluid. This is also known as your "water breaking." It could be a slow trickle or a gush. Let your health care provider know if it has a color or strange odor. If you have any of these signs, call your health care provider right away, even if it is before your due date. Follow these instructions at home: Medicines  Follow your health care provider's instructions regarding medicine use. Specific medicines may be either safe or unsafe to take during pregnancy.  Take a prenatal vitamin that contains at least 600 micrograms (mcg) of folic acid.  If you develop constipation, try taking a stool softener if your health care provider approves. Eating and drinking   Eat a balanced diet that includes fresh fruits and vegetables, whole grains, good sources of protein such as meat, eggs, or tofu,  and low-fat dairy. Your health care provider will help you determine the amount of weight gain that is right for you.  Avoid raw meat and uncooked cheese. These carry germs that can cause birth defects in the baby.  If you have low calcium intake from food, talk to your health care provider about whether you should take a daily calcium supplement.  Eat four or five small meals rather than three large meals a day.  Limit foods that are high in fat and processed sugars, such as fried and sweet foods.  To prevent constipation: ? Drink enough fluid to keep your urine clear or pale yellow. ? Eat foods that are high in fiber, such as fresh fruits and vegetables, whole grains, and beans. Activity  Exercise only as directed by your health care provider. Most women can continue their usual exercise routine during pregnancy. Try to exercise for 30 minutes at least 5 days a week. Stop exercising if you experience uterine contractions.  Avoid heavy lifting.  Do   not exercise in extreme heat or humidity, or at high altitudes.  Wear low-heel, comfortable shoes.  Practice good posture.  You may continue to have sex unless your health care provider tells you otherwise. Relieving pain and discomfort  Take frequent breaks and rest with your legs elevated if you have leg cramps or low back pain.  Take warm sitz baths to soothe any pain or discomfort caused by hemorrhoids. Use hemorrhoid cream if your health care provider approves.  Wear a good support bra to prevent discomfort from breast tenderness.  If you develop varicose veins: ? Wear support pantyhose or compression stockings as told by your healthcare provider. ? Elevate your feet for 15 minutes, 3-4 times a day. Prenatal care  Write down your questions. Take them to your prenatal visits.  Keep all your prenatal visits as told by your health care provider. This is important. Safety  Wear your seat belt at all times when driving.  Make  a list of emergency phone numbers, including numbers for family, friends, the hospital, and police and fire departments. General instructions  Avoid cat litter boxes and soil used by cats. These carry germs that can cause birth defects in the baby. If you have a cat, ask someone to clean the litter box for you.  Do not travel far distances unless it is absolutely necessary and only with the approval of your health care provider.  Do not use hot tubs, steam rooms, or saunas.  Do not drink alcohol.  Do not use any products that contain nicotine or tobacco, such as cigarettes and e-cigarettes. If you need help quitting, ask your health care provider.  Do not use any medicinal herbs or unprescribed drugs. These chemicals affect the formation and growth of the baby.  Do not douche or use tampons or scented sanitary pads.  Do not cross your legs for long periods of time.  To prepare for the arrival of your baby: ? Take prenatal classes to understand, practice, and ask questions about labor and delivery. ? Make a trial run to the hospital. ? Visit the hospital and tour the maternity area. ? Arrange for maternity or paternity leave through employers. ? Arrange for family and friends to take care of pets while you are in the hospital. ? Purchase a rear-facing car seat and make sure you know how to install it in your car. ? Pack your hospital bag. ? Prepare the baby's nursery. Make sure to remove all pillows and stuffed animals from the baby's crib to prevent suffocation.  Visit your dentist if you have not gone during your pregnancy. Use a soft toothbrush to brush your teeth and be gentle when you floss. Contact a health care provider if:  You are unsure if you are in labor or if your water has broken.  You become dizzy.  You have mild pelvic cramps, pelvic pressure, or nagging pain in your abdominal area.  You have lower back pain.  You have persistent nausea, vomiting, or  diarrhea.  You have an unusual or bad smelling vaginal discharge.  You have pain when you urinate. Get help right away if:  Your water breaks before 37 weeks.  You have regular contractions less than 5 minutes apart before 37 weeks.  You have a fever.  You are leaking fluid from your vagina.  You have spotting or bleeding from your vagina.  You have severe abdominal pain or cramping.  You have rapid weight loss or weight gain.  You have   shortness of breath with chest pain.  You notice sudden or extreme swelling of your face, hands, ankles, feet, or legs.  Your baby makes fewer than 10 movements in 2 hours.  You have severe headaches that do not go away when you take medicine.  You have vision changes. Summary  The third trimester is from week 28 through week 40, months 7 through 9. The third trimester is a time when the unborn baby (fetus) is growing rapidly.  During the third trimester, your discomfort may increase as you and your baby continue to gain weight. You may have abdominal, leg, and back pain, sleeping problems, and an increased need to urinate.  During the third trimester your breasts will keep growing and they will continue to become tender. A yellow fluid (colostrum) may leak from your breasts. This is the first milk you are producing for your baby.  False labor is a condition in which you feel small, irregular tightenings of the muscles in the womb (contractions) that eventually go away. These are called Braxton Hicks contractions. Contractions may last for hours, days, or even weeks before true labor sets in.  Signs of labor can include: abdominal cramps; regular contractions that start at 10 minutes apart and become stronger and more frequent with time; watery or bloody mucus discharge that comes from the vagina; increased pelvic pressure and dull back pain; and leaking of amniotic fluid. This information is not intended to replace advice given to you by your  health care provider. Make sure you discuss any questions you have with your health care provider. Document Revised: 11/19/2018 Document Reviewed: 09/03/2016 Elsevier Patient Education  2020 Elsevier Inc.  

## 2019-10-25 NOTE — Progress Notes (Signed)
   PRENATAL VISIT NOTE  Subjective:  Shelby Orr is a 29 y.o. [id number] at [redacted]w[redacted]d being seen today for ongoing prenatal care.  She is currently monitored for the following issues for this low-risk pregnancy and has Supervision of normal pregnancy, antepartum on their problem list.  Patient reports occasional contractions.  Contractions: Not present.  .  Movement: Present. Denies leaking of fluid.   The following portions of the patient's history were reviewed and updated as appropriate: allergies, current medications, past family history, past medical history, past social history, past surgical history and problem list.   Objective:   Vitals:   10/25/19 0933  BP: 105/90  Pulse: 74  Weight: 144 lb 6.4 oz (65.5 kg)    Fetal Status:   Fundal Height: 36 cm Movement: Present  Presentation: Vertex  General:  Alert, oriented and cooperative. Patient is in no acute distress.  Skin: Skin is warm and dry. No rash noted.   Cardiovascular: Normal heart rate noted  Respiratory: Normal respiratory effort, no problems with respiration noted  Abdomen: Soft, gravid, appropriate for gestational age.  Pain/Pressure: Present     Pelvic: Cervical exam performed Dilation: 1.5 Effacement (%): 50 Station: -2  Extremities: Normal range of motion.  Edema: None  Mental Status: Normal mood and affect. Normal behavior. Normal judgment and thought content.   Assessment and Plan:  Pregnancy: G4P3003 at [redacted]w[redacted]d 1. Pelvic pain affecting pregnancy in third trimester, antepartum --Improved now with support belt, urine culture was ordered at previous visit, pt left sample but not resulted.  Given improvement in symptoms with support belt and pt now 36 weeks, no urine culture needed at this time.  --Now pt having intermittent episodes of contractions @ 20 minutes that resolve with rest.  Labor precautions reviewed.  2. Encounter for supervision of normal pregnancy, antepartum, unspecified gravidity --Anticipatory  guidance about next visits/weeks of pregnancy given. --Next visit virtual at 38 weeks  - Culture, beta strep (group b only) - GC/Chlamydia probe amp ([place])not at [facility]  Term labor symptoms and general obstetric precautions including but not limited to vaginal bleeding, contractions, leaking of fluid and fetal movement were reviewed in detail with the patient. Please refer to After Visit Summary for other counseling recommendations.   Return in about 2 weeks (around 11/08/2019).  No future appointments.  Sharen Counter, CNM

## 2019-10-26 LAB — GC/CHLAMYDIA PROBE AMP (~~LOC~~) NOT AT ARMC
Chlamydia: NEGATIVE
Comment: NEGATIVE
Comment: NORMAL
Neisseria Gonorrhea: NEGATIVE

## 2019-10-29 LAB — CULTURE, BETA STREP (GROUP B ONLY): Strep Gp B Culture: NEGATIVE

## 2019-11-08 ENCOUNTER — Encounter: Payer: Self-pay | Admitting: Advanced Practice Midwife

## 2019-11-08 ENCOUNTER — Telehealth (INDEPENDENT_AMBULATORY_CARE_PROVIDER_SITE_OTHER): Payer: Medicaid Other | Admitting: Advanced Practice Midwife

## 2019-11-08 DIAGNOSIS — Z3009 Encounter for other general counseling and advice on contraception: Secondary | ICD-10-CM

## 2019-11-08 DIAGNOSIS — Z3A38 38 weeks gestation of pregnancy: Secondary | ICD-10-CM

## 2019-11-08 DIAGNOSIS — Z3493 Encounter for supervision of normal pregnancy, unspecified, third trimester: Secondary | ICD-10-CM

## 2019-11-08 DIAGNOSIS — Z3490 Encounter for supervision of normal pregnancy, unspecified, unspecified trimester: Secondary | ICD-10-CM

## 2019-11-08 NOTE — Progress Notes (Signed)
   TELEHEALTH VIRTUAL OBSTETRICS VISIT ENCOUNTER NOTE  I connected with Shelby Orr on 11/08/19 at  9:55 AM EDT by telephone at home and verified that I am speaking with the correct person using two identifiers.   I discussed the limitations, risks, security and privacy concerns of performing an evaluation and management service by telephone and the availability of in person appointments. I also discussed with the patient that there may be a patient responsible charge related to this service. The patient expressed understanding and agreed to proceed.  Subjective:  Shelby Orr is a 29 y.o. P3X9024 at [redacted]w[redacted]d being followed for ongoing prenatal care.  She is currently monitored for the following issues for this low-risk pregnancy and has Supervision of normal pregnancy, antepartum on their problem list.  Patient reports occasional contractions. Reports fetal movement. Denies any contractions, bleeding or leaking of fluid.   The following portions of the patient's history were reviewed and updated as appropriate: allergies, current medications, past family history, past medical history, past social history, past surgical history and problem list.   Objective:   General:  Alert, oriented and cooperative.   Mental Status: Normal mood and affect perceived. Normal judgment and thought content.  Rest of physical exam deferred due to type of encounter  Assessment and Plan:  Pregnancy: G4P3003 at [redacted]w[redacted]d 1. Encounter for supervision of normal pregnancy, antepartum, unspecified gravidity --Pt reports good fetal movement, denies cramping, LOF, or vaginal bleeding --Anticipatory guidance about next visits/weeks of pregnancy given. --Next visit in 1 week in office  2. Encounter for counseling regarding contraception --Discussed contraceptive options, pt desires IUD. Reviewed options and pt would like Liletta placed in the hospital immediately post delivery of the placenta.  She is aware of increased  risk of expulsion.  String check will verify IUD presence/position at her 4-6 week PP visit.   Term labor symptoms and general obstetric precautions including but not limited to vaginal bleeding, contractions, leaking of fluid and fetal movement were reviewed in detail with the patient.  I discussed the assessment and treatment plan with the patient. The patient was provided an opportunity to ask questions and all were answered. The patient agreed with the plan and demonstrated an understanding of the instructions. The patient was advised to call back or seek an in-person office evaluation/go to MAU at [facility] for any urgent or concerning symptoms. Please refer to After Visit Summary for other counseling recommendations.   I provided 10 minutes of non-face-to-face time during this encounter.  No follow-ups on file.  No future appointments.  Sharen Counter, CNM [facility], [facility]

## 2019-11-08 NOTE — Progress Notes (Signed)
Virtual ROB   GBS was NEG.GC/CT was Neg.  CC: none    Pt wants to discuss IUD after delivery.

## 2019-11-14 ENCOUNTER — Inpatient Hospital Stay (HOSPITAL_COMMUNITY)
Admission: AD | Admit: 2019-11-14 | Discharge: 2019-11-16 | DRG: 807 | Disposition: A | Discharge status: Home / Self Care | Payer: Medicaid Other | Attending: Family Medicine | Admitting: Family Medicine

## 2019-11-14 ENCOUNTER — Encounter (HOSPITAL_COMMUNITY): Payer: Self-pay | Admitting: Obstetrics & Gynecology

## 2019-11-14 ENCOUNTER — Inpatient Hospital Stay (HOSPITAL_COMMUNITY): Payer: Medicaid Other | Admitting: Anesthesiology

## 2019-11-14 DIAGNOSIS — Z3A39 39 weeks gestation of pregnancy: Secondary | ICD-10-CM

## 2019-11-14 DIAGNOSIS — O26893 Other specified pregnancy related conditions, third trimester: Secondary | ICD-10-CM | POA: Diagnosis present

## 2019-11-14 DIAGNOSIS — Z20822 Contact with and (suspected) exposure to covid-19: Secondary | ICD-10-CM | POA: Diagnosis present

## 2019-11-14 DIAGNOSIS — Z3490 Encounter for supervision of normal pregnancy, unspecified, unspecified trimester: Secondary | ICD-10-CM

## 2019-11-14 LAB — CBC
HCT: 35.3 % — ABNORMAL LOW (ref 36.0–46.0)
Hemoglobin: 11 g/dL — ABNORMAL LOW (ref 12.0–15.0)
MCH: 28.9 pg (ref 26.0–34.0)
MCHC: 31.2 g/dL (ref 30.0–36.0)
MCV: 92.7 fL (ref 80.0–100.0)
Platelets: 308 10*3/uL (ref 150–400)
RBC: 3.81 MIL/uL — ABNORMAL LOW (ref 3.87–5.11)
RDW: 12.2 % (ref 11.5–15.5)
WBC: 10.6 10*3/uL — ABNORMAL HIGH (ref 4.0–10.5)
nRBC: 0 % (ref 0.0–0.2)

## 2019-11-14 LAB — RPR: RPR Ser Ql: NONREACTIVE

## 2019-11-14 LAB — ABO/RH: ABO/RH(D): A POS

## 2019-11-14 LAB — RESPIRATORY PANEL BY RT PCR (FLU A&B, COVID)
Influenza A by PCR: NEGATIVE
Influenza B by PCR: NEGATIVE
SARS Coronavirus 2 by RT PCR: NEGATIVE

## 2019-11-14 LAB — TYPE AND SCREEN
ABO/RH(D): A POS
Antibody Screen: NEGATIVE

## 2019-11-14 MED ORDER — BENZOCAINE-MENTHOL 20-0.5 % EX AERO
1.0000 "application " | INHALATION_SPRAY | CUTANEOUS | Status: DC | PRN
  Administered 2019-11-14: 1 via TOPICAL
  Filled 2019-11-14: qty 56

## 2019-11-14 MED ORDER — PHENYLEPHRINE 40 MCG/ML (10ML) SYRINGE FOR IV PUSH (FOR BLOOD PRESSURE SUPPORT): 80.0000 ug | PREFILLED_SYRINGE | INTRAVENOUS | Status: DC | PRN

## 2019-11-14 MED ORDER — ACETAMINOPHEN 325 MG PO TABS: 650.0000 mg | ORAL_TABLET | ORAL | Status: DC | PRN

## 2019-11-14 MED ORDER — OXYTOCIN 40 UNITS IN NORMAL SALINE INFUSION - SIMPLE MED
1.0000 m[IU]/min | INTRAVENOUS | Status: DC
  Administered 2019-11-14: 2 m[IU]/min via INTRAVENOUS
  Filled 2019-11-14: qty 1000

## 2019-11-14 MED ORDER — FENTANYL-BUPIVACAINE-NACL 0.5-0.125-0.9 MG/250ML-% EP SOLN
12.0000 mL/h | EPIDURAL | Status: DC | PRN
  Filled 2019-11-14: qty 250

## 2019-11-14 MED ORDER — PHENYLEPHRINE 40 MCG/ML (10ML) SYRINGE FOR IV PUSH (FOR BLOOD PRESSURE SUPPORT)
80.0000 ug | PREFILLED_SYRINGE | INTRAVENOUS | Status: DC | PRN
  Filled 2019-11-14: qty 10

## 2019-11-14 MED ORDER — TETANUS-DIPHTH-ACELL PERTUSSIS 5-2.5-18.5 LF-MCG/0.5 IM SUSP: 0.5000 mL | Freq: Once | INTRAMUSCULAR | Status: DC

## 2019-11-14 MED ORDER — SIMETHICONE 80 MG PO CHEW: 80.0000 mg | CHEWABLE_TABLET | ORAL | Status: DC | PRN

## 2019-11-14 MED ORDER — SODIUM CHLORIDE (PF) 0.9 % IJ SOLN
INTRAMUSCULAR | Status: DC | PRN
  Administered 2019-11-14: 12 mL/h via EPIDURAL

## 2019-11-14 MED ORDER — DIPHENHYDRAMINE HCL 50 MG/ML IJ SOLN: 12.5000 mg | INTRAMUSCULAR | Status: DC | PRN

## 2019-11-14 MED ORDER — COCONUT OIL OIL: 1.0000 "application " | TOPICAL_OIL | Status: DC | PRN

## 2019-11-14 MED ORDER — ZOLPIDEM TARTRATE 5 MG PO TABS: 5.0000 mg | ORAL_TABLET | Freq: Every evening | ORAL | Status: DC | PRN

## 2019-11-14 MED ORDER — EPHEDRINE 5 MG/ML INJ: 10.0000 mg | INTRAVENOUS | Status: DC | PRN

## 2019-11-14 MED ORDER — ONDANSETRON HCL 4 MG/2ML IJ SOLN: 4.0000 mg | Freq: Four times a day (QID) | INTRAMUSCULAR | Status: DC | PRN

## 2019-11-14 MED ORDER — LACTATED RINGERS IV SOLN
500.0000 mL | INTRAVENOUS | Status: DC | PRN
  Administered 2019-11-14: 500 mL via INTRAVENOUS

## 2019-11-14 MED ORDER — ONDANSETRON HCL 4 MG/2ML IJ SOLN: 4.0000 mg | INTRAMUSCULAR | Status: DC | PRN

## 2019-11-14 MED ORDER — OXYCODONE-ACETAMINOPHEN 5-325 MG PO TABS: 1.0000 | ORAL_TABLET | ORAL | Status: DC | PRN

## 2019-11-14 MED ORDER — OXYCODONE-ACETAMINOPHEN 5-325 MG PO TABS: 2.0000 | ORAL_TABLET | ORAL | Status: DC | PRN

## 2019-11-14 MED ORDER — ONDANSETRON HCL 4 MG PO TABS: 4.0000 mg | ORAL_TABLET | ORAL | Status: DC | PRN

## 2019-11-14 MED ORDER — IBUPROFEN 600 MG PO TABS
600.0000 mg | ORAL_TABLET | Freq: Four times a day (QID) | ORAL | Status: DC
  Administered 2019-11-14 – 2019-11-16 (×9): 600 mg via ORAL
  Filled 2019-11-14 (×10): qty 1

## 2019-11-14 MED ORDER — LACTATED RINGERS IV SOLN: INTRAVENOUS | Status: DC

## 2019-11-14 MED ORDER — EPHEDRINE 5 MG/ML INJ
10.0000 mg | INTRAVENOUS | Status: DC | PRN
  Filled 2019-11-14: qty 10

## 2019-11-14 MED ORDER — OXYTOCIN 40 UNITS IN NORMAL SALINE INFUSION - SIMPLE MED: 2.5000 [IU]/h | INTRAVENOUS | Status: DC

## 2019-11-14 MED ORDER — PRENATAL MULTIVITAMIN CH
1.0000 | ORAL_TABLET | Freq: Every day | ORAL | Status: DC
  Administered 2019-11-15 – 2019-11-16 (×2): 1 via ORAL
  Filled 2019-11-14 (×2): qty 1

## 2019-11-14 MED ORDER — FLEET ENEMA 7-19 GM/118ML RE ENEM: 1.0000 | ENEMA | RECTAL | Status: DC | PRN

## 2019-11-14 MED ORDER — LACTATED RINGERS IV SOLN
500.0000 mL | Freq: Once | INTRAVENOUS | Status: AC
  Administered 2019-11-14: 500 mL via INTRAVENOUS

## 2019-11-14 MED ORDER — OXYTOCIN BOLUS FROM INFUSION
500.0000 mL | Freq: Once | INTRAVENOUS | Status: AC
  Administered 2019-11-14: 500 mL via INTRAVENOUS

## 2019-11-14 MED ORDER — DIPHENHYDRAMINE HCL 25 MG PO CAPS: 25.0000 mg | ORAL_CAPSULE | Freq: Four times a day (QID) | ORAL | Status: DC | PRN

## 2019-11-14 MED ORDER — TERBUTALINE SULFATE 1 MG/ML IJ SOLN: 0.2500 mg | Freq: Once | INTRAMUSCULAR | Status: DC | PRN

## 2019-11-14 MED ORDER — LIDOCAINE HCL (PF) 1 % IJ SOLN: 30.0000 mL | INTRAMUSCULAR | Status: DC | PRN

## 2019-11-14 MED ORDER — LIDOCAINE-EPINEPHRINE (PF) 2 %-1:200000 IJ SOLN
INTRAMUSCULAR | Status: DC | PRN
  Administered 2019-11-14: 3 mL via EPIDURAL
  Administered 2019-11-14: 2 mL via EPIDURAL

## 2019-11-14 MED ORDER — WITCH HAZEL-GLYCERIN EX PADS: 1.0000 "application " | MEDICATED_PAD | CUTANEOUS | Status: DC | PRN

## 2019-11-14 MED ORDER — SOD CITRATE-CITRIC ACID 500-334 MG/5ML PO SOLN: 30.0000 mL | ORAL | Status: DC | PRN

## 2019-11-14 MED ORDER — DIBUCAINE (PERIANAL) 1 % EX OINT: 1.0000 "application " | TOPICAL_OINTMENT | CUTANEOUS | Status: DC | PRN

## 2019-11-14 MED ORDER — SENNOSIDES-DOCUSATE SODIUM 8.6-50 MG PO TABS
2.0000 | ORAL_TABLET | ORAL | Status: DC
  Administered 2019-11-14 – 2019-11-15 (×2): 2 via ORAL
  Filled 2019-11-14 (×2): qty 2

## 2019-11-14 NOTE — Anesthesia Preprocedure Evaluation (Signed)
Anesthesia Evaluation  Patient identified by MRN, date of birth, ID band Patient awake    Reviewed: Allergy & Precautions, NPO status , Patient's Chart, lab work & pertinent test results  Airway Mallampati: II  TM Distance: >3 FB Neck ROM: Full    Dental no notable dental hx.    Pulmonary neg pulmonary ROS,    Pulmonary exam normal breath sounds clear to auscultation       Cardiovascular negative cardio ROS Normal cardiovascular exam Rhythm:Regular Rate:Normal     Neuro/Psych  Headaches, negative psych ROS   GI/Hepatic negative GI ROS, Neg liver ROS,   Endo/Other  negative endocrine ROS  Renal/GU negative Renal ROS  negative genitourinary   Musculoskeletal negative musculoskeletal ROS (+)   Abdominal   Peds  Hematology negative hematology ROS (+)   Anesthesia Other Findings   Reproductive/Obstetrics (+) Pregnancy                             Anesthesia Physical Anesthesia Plan  ASA: II  Anesthesia Plan: Epidural   Post-op Pain Management:    Induction:   PONV Risk Score and Plan: Treatment may vary due to age or medical condition  Airway Management Planned: Natural Airway  Additional Equipment:   Intra-op Plan:   Post-operative Plan:   Informed Consent: I have reviewed the patients History and Physical, chart, labs and discussed the procedure including the risks, benefits and alternatives for the proposed anesthesia with the patient or authorized representative who has indicated his/her understanding and acceptance.       Plan Discussed with: Anesthesiologist  Anesthesia Plan Comments: (Patient identified. Risks, benefits, options discussed with patient including but not limited to bleeding, infection, nerve damage, paralysis, failed block, incomplete pain control, headache, blood pressure changes, nausea, vomiting, reactions to medication, itching, and post partum back  pain. Confirmed with bedside nurse the patient's most recent platelet count. Confirmed with the patient that they are not taking any anticoagulation, have any bleeding history or any family history of bleeding disorders. Patient expressed understanding and wishes to proceed. All questions were answered. )        Anesthesia Quick Evaluation  

## 2019-11-14 NOTE — MAU Note (Signed)
CTX approximately 5 minutes apart since 0000.  Denies LOF/VB.  + FM.  Denies complications w/ her pregnancy.

## 2019-11-14 NOTE — Discharge Summary (Deleted)
Postpartum Discharge Summary       Patient Name: Shelby Orr DOB: Oct 24, 1990 MRN: [id number]  Date of admission: 11/14/2019 Delivering Provider: Marcille Buffy D   Date of discharge: 11/15/2019  Admitting diagnosis: Indication for care in labor or delivery [O75.9] Intrauterine pregnancy: [redacted]w[redacted]d    Secondary diagnosis:  Active Problems:   Indication for care in labor or delivery  Additional problems: None     Discharge diagnosis: Term Pregnancy Delivered                                                                                                Post partum procedures:NA  Augmentation: Pitocin  Complications: None  Hospital course:  Onset of Labor With Vaginal Delivery     29y.o. yo [phone] 355w2das admitted in Active Labor on 11/14/2019. Patient had an uncomplicated labor course as follows:  Membrane Rupture Time/Date: 9:12 AM ,11/14/2019   Intrapartum Procedures: Episiotomy: None [1]                                         Lacerations:  None [1]  Patient had a delivery of a Viable infant. 11/14/2019  Information for the patient's newborn:  IdCara, Thaxtonirl [id number]]Delivery Method: Vag-Spont     Pateint had an uncomplicated postpartum course.  She is ambulating, tolerating a regular diet, passing flatus, and urinating well. Patient is discharged home in stable condition on 11/15/19.  Delivery time: 9:15 AM    Magnesium Sulfate received: No BMZ received: No Rhophylac:N/A MMR:N/A Transfusion:No  Physical exam  Vitals:   11/14/19 1605 11/14/19 1928 11/14/19 2330 11/15/19 0500  BP: (!) 124/56 126/77 127/66 113/64  Pulse: 61 (!) 54 66 (!) 53  Resp: 16 18 18 16   Temp: 98.1 F (36.7 C) 98.5 F (36.9 C) 98.4 F (36.9 C) 98.2 F (36.8 C)  TempSrc: Oral Oral Oral Oral  SpO2: 100% 100% 100% 100%  Weight:      Height:       General: alert and no distress Lochia: appropriate Uterine Fundus: firm Incision: N/A DVT Evaluation: No evidence of DVT seen on  physical exam. No cords or calf tenderness. Labs: Lab Results  Component Value Date   WBC 10.6 (H) 11/14/2019   HGB 11.0 (L) 11/14/2019   HCT 35.3 (L) 11/14/2019   MCV 92.7 11/14/2019   PLT 308 11/14/2019   No flowsheet data found. Edinburgh Score: No flowsheet data found.  Discharge instruction: per After Visit Summary and "Baby and Me Booklet".  After visit meds:  Allergies as of 11/15/2019   No Known Allergies     Medication List    STOP taking these medications   Blood Pressure Kit [place] Med Misc   PRENATAL VITAMIN AND MINERAL PO       Diet: No evidence of DVT seen on physical exam. No cords or calf tenderness.  Activity: Advance as tolerated. Pelvic rest for 6 weeks.   Outpatient  follow up:6 weeks Follow up Appt: Future Appointments  Date Time Provider Yates  11/16/2019  9:55 AM Nugent, Gerrie Nordmann, NP [facility] None   Follow up Visit:    Please schedule this patient for Postpartum visit in: 4 weeks with the following provider: Any provider In-Person For C/S patients schedule nurse incision check in weeks 2 weeks: no Low risk pregnancy complicated by: none Delivery mode:  SVD Anticipated Birth Control:  IUD PP Procedures needed: none  Schedule Integrated BH visit: no     Newborn Data: Live born female  Birth Weight:   APGAR: 73, 9  Newborn Delivery   Birth date/time: 11/14/2019 09:15:00 Delivery type: Vaginal, Spontaneous      Baby Feeding: Breast Disposition:home with mother

## 2019-11-14 NOTE — Lactation Note (Signed)
This note was copied from a baby's chart. Lactation Consultation Note  Patient Name: Shelby Orr EBRAX'E Date: 11/14/2019 Reason for consult: Initial assessment;Term  Visited with mom of a 7 hours old FT female, she's a P4 and experienced BF. Per mom, she BF her last two kids 4 and 6 months respectively. She is familiar with hand expression, but asked LC for a review. LC assisted with hand expression at both breasts but unable to get colostrum at this point. She has a Medela DEBP at home.   Offered assistance with latch and mom agreed to wake baby up to feed. LC unwrapped baby, checked her diaper but she barely woke up. Mom told LC that baby has been very spitty and the last time they tried to feed her she didn't latch because she was still spitting up. When LC tried to do suck training with baby, she had a strong gagging reflex and LC couldn't continue, baby kept gagging.   LC took baby STS to mother's right breast in cross cradle position but she wouldn't latch, she did 2-3 sucks and fell asleep. Asked mom to call for assistance when needed. Reviewed normal newborn behavior, feeding cues, cluster feeding and size of baby's stomach. Baby doing STS with mom when exiting the room, mom told LC baby's temperatures were on the lower side, and LC recommended STS care.   Feeding plan:  1. Encouraged mom to keep offering the breast STS 8-12 times/24 hours or sooner if feeding cues are present 2. Hand expression and finger feeding were also encouraged, mom understands breast stimulation is crucial for the start of lactogenesis II and to see colostrum  BF brochure, BF resources and feeding diary were reviewed. Mom reported all questions and concerns were answered, she's aware of LC OP services and will call PRN.   Maternal Data Formula Feeding for Exclusion: No Has patient been taught Hand Expression?: Yes Does the patient have breastfeeding experience prior to this delivery?:  Yes  Feeding Feeding Type: Breast Fed  LATCH Score                   Interventions Interventions: Breast feeding basics reviewed;Assisted with latch;Skin to skin;Breast massage;Hand express;Breast compression;Adjust position;Support pillows  Lactation Tools Discussed/Used WIC Program: No   Consult Status Consult Status: Follow-up Date: 11/15/19 Follow-up type: In-patient    Ahni Bradwell Venetia Constable 11/14/2019, 5:00 PM

## 2019-11-14 NOTE — Anesthesia Procedure Notes (Signed)
Epidural Patient location during procedure: OB Start time: 11/14/2019 5:00 AM End time: 11/14/2019 5:15 AM  Staffing Anesthesiologist: Elmer Picker, MD Performed: anesthesiologist   Preanesthetic Checklist Completed: patient identified, IV checked, risks and benefits discussed, monitors and equipment checked, pre-op evaluation and timeout performed  Epidural Patient position: sitting Prep: DuraPrep and site prepped and draped Patient monitoring: continuous pulse ox, blood pressure, heart rate and cardiac monitor Approach: midline Location: L3-L4 Injection technique: LOR air  Needle:  Needle type: Tuohy  Needle gauge: 17 G Needle length: 9 cm Needle insertion depth: 6 cm Catheter type: closed end flexible Catheter size: 19 Gauge Catheter at skin depth: 12 cm Test dose: negative  Assessment Sensory level: T8 Events: blood not aspirated, injection not painful, no injection resistance, no paresthesia and negative IV test  Additional Notes Patient identified. Risks/Benefits/Options discussed with patient including but not limited to bleeding, infection, nerve damage, paralysis, failed block, incomplete pain control, headache, blood pressure changes, nausea, vomiting, reactions to medication both or allergic, itching and postpartum back pain. Confirmed with bedside nurse the patient's most recent platelet count. Confirmed with patient that they are not currently taking any anticoagulation, have any bleeding history or any family history of bleeding disorders. Patient expressed understanding and wished to proceed. All questions were answered. Sterile technique was used throughout the entire procedure. Please see nursing notes for vital signs. Test dose was given through epidural catheter and negative prior to continuing to dose epidural or start infusion. Warning signs of high block given to the patient including shortness of breath, tingling/numbness in hands, complete motor block, or  any concerning symptoms with instructions to call for help. Patient was given instructions on fall risk and not to get out of bed. All questions and concerns addressed with instructions to call with any issues or inadequate analgesia.  Reason for block:procedure for pain

## 2019-11-14 NOTE — Anesthesia Postprocedure Evaluation (Signed)
Anesthesia Post Note  Patient: Shelby Orr  Procedure(s) Performed: AN AD HOC LABOR EPIDURAL     Patient location during evaluation: Mother Baby Anesthesia Type: Epidural Level of consciousness: awake Pain management: satisfactory to patient Vital Signs Assessment: post-procedure vital signs reviewed and stable Respiratory status: spontaneous breathing Cardiovascular status: stable Anesthetic complications: no    Last Vitals:  Vitals:   11/14/19 1100 11/14/19 1207  BP: 125/65 (!) 100/55  Pulse: 64 63  Resp: 16 16  Temp: 36.5 C 37 C  SpO2: 100% 100%    Last Pain:  Vitals:   11/14/19 1207  TempSrc: Oral  PainSc: 0-No pain   Pain Goal:                   [employer]

## 2019-11-14 NOTE — Progress Notes (Signed)
Labor Progress Note Onedia Vargus is a 29 y.o. [id number] at [redacted]w[redacted]d presented for SOL  S:  Experiences lower back and rectal discomfort with contractions.   O:  BP 109/84   Pulse 79   Temp 97.6 F (36.4 C) (Oral)   Resp 18   Ht 5\' 5"  (1.651 m)   Wt 68 kg   LMP 02/12/2019   SpO2 100%   BMI 24.95 kg/m  EFM: 120s/moderate variability / pos accels, early decels   CVE: Dilation: 7 Effacement (%): 90 Cervical Position: Posterior Station: -1 Presentation: Vertex Exam by:: [email or web address], RN   A&P: 29 y.o. 26 [redacted]w[redacted]d for IOL  #Labor: Progressing well. Continue pitocin. Desires AROM will reassess with CNM for AROM.  #Pain: epidural  #FWB: Cat 1  #GBS negative   Nori Winegar, DO 8:51 AM

## 2019-11-14 NOTE — H&P (Addendum)
OBSTETRIC ADMISSION HISTORY AND PHYSICAL  Shelby Orr is a 29 y.o. female [phone] with IUP at 21w2dby LMP presenting for SOL. She reports +FMs, No LOF, no VB, no blurry vision, headaches or peripheral edema, and RUQ pain.  She plans on breast feeding. She request post partum IUD for birth control. Breast, IUD, girl.  [facility]  Dating: By LMP --->  Estimated Date of Delivery: 11/19/19  Sono:    @[redacted]w[redacted]d , CWD, normal anatomy, cephalic presentation, longitudinal lie, 289g, 68% EFW   Prenatal History/Complications: - none   Past Medical History: Past Medical History:  Diagnosis Date  . Headache     Past Surgical History: Past Surgical History:  Procedure Laterality Date  . NO PAST SURGERIES      Obstetrical History: OB History    Gravida  4   Para  3   Term  3   Preterm      AB      Living  3     SAB      TAB      Ectopic      Multiple      Live Births  3           Social History Social History   Socioeconomic History  . Marital status: Married    Spouse name: Not on file  . Number of children: Not on file  . Years of education: Not on file  . Highest education level: Not on file  Occupational History  . Occupation: Hair Stylist  Tobacco Use  . Smoking status: Never Smoker  . Smokeless tobacco: Never Used  Substance and Sexual Activity  . Alcohol use: No  . Drug use: No  . Sexual activity: Yes    Birth control/protection: None  Other Topics Concern  . Not on file  Social History Narrative  . Not on file   Social Determinants of Health   Financial Resource Strain:   . Difficulty of Paying Living Expenses:   Food Insecurity:   . Worried About RCharity fundraiserin the Last Year:   . RArboriculturistin the Last Year:   Transportation Needs:   . LFilm/video editor(Medical):   .Marland KitchenLack of Transportation (Non-Medical):   Physical Activity:   . Days of Exercise per Week:   . Minutes of Exercise per  Session:   Stress:   . Feeling of Stress :   Social Connections:   . Frequency of Communication with Friends and Family:   . Frequency of Social Gatherings with Friends and Family:   . Attends Religious Services:   . Active Member of Clubs or Organizations:   . Attends CArchivistMeetings:   .Marland KitchenMarital Status:     Family History: Family History  Problem Relation Age of Onset  . Stroke Mother   . Hypertension Father   . Diabetes Paternal Grandfather     Allergies: No Known Allergies  Medications Prior to Admission  Medication Sig Dispense Refill Last Dose  . Prenatal Vit-Fe Fumarate-FA (PRENATAL VITAMIN AND MINERAL PO) Take by mouth.   11/14/2019 at Unknown time  . Blood Pressure Monitoring (BLOOD PRESSURE KIT) DEVI 1 kit by Does not apply route once a week. Check BP regularly.  Large Cuff  DX: O90.0 1 kit 0   . Elastic Bandages & Supports (COMFORT FIT MATERNITY SUPP MED) MISC 1 Device by Does not apply route daily. 1 each 0  Review of Systems   All systems reviewed and negative except as stated in HPI  Blood pressure 122/63, pulse (!) 56, temperature 98.3 F (36.8 C), temperature source Oral, resp. rate 18, height 5' 5"  (1.651 m), weight 68 kg, last menstrual period 02/12/2019. General appearance: alert, cooperative, appears stated age and moderate distress Lungs: clear to auscultation bilaterally Heart: regular rate and rhythm Abdomen: soft, non-tender; bowel sounds normal Pelvic:  Extremities: Homans sign is negative, no sign of DVT DTR's normal Presentation: cephalic Fetal monitoringBaseline: 120 bpm, Variability: Good {> 6 bpm) and Accelerations: Reactive Uterine activityFrequency: Every 4 minutes Dilation: 4.5 Effacement (%): 70 Station: -2 Exam by:: Esau Grew, RN   Prenatal labs: ABO, Rh: --/--/PENDING (04/04 0305) Antibody: PENDING (04/04 0305) Rubella: 16.50 (10/30 1042) RPR: Non Reactive (01/19 1012)  HBsAg: Negative (10/30 1042)  HIV:  Non Reactive (01/19 1012)  GBS: Negative/-- (03/15 0958)  1 hr Glucola normal Genetic screening  Low risk NIPS Anatomy US normal  Prenatal Transfer Tool  Maternal Diabetes: No Genetic Screening: Normal Maternal Ultrasounds/Referrals: Normal Fetal Ultrasounds or other Referrals:  None Maternal Substance Abuse:  No Significant Maternal Medications:  None Significant Maternal Lab Results: Group B Strep negative  Results for orders placed or performed during the hospital encounter of 11/14/19 (from the past 24 hour(s))  Type and screen [place]   Collection Time: 11/14/19  3:05 AM  Result Value Ref Range   ABO/RH(D) PENDING    Antibody Screen PENDING    Sample Expiration      11/17/2019,2359 Performed at [facility], [address], [place], [place] [address]     Patient Active Problem List   Diagnosis Date Noted  . Indication for care in labor or delivery 11/14/2019  . Supervision of normal pregnancy, antepartum 06/10/2019    Assessment/Plan:  Shelby Orr is a 29 y.o. [id number] at 74w2dhere for SOL  #Labor:expectant management #Pain: Pt desires epidural #FWB: Cat I #ID:  gbs neg #MOF: breast #MOC: ppIUD (liletta) #Circ:  n/a  DBenay Pike MD  11/14/2019, 4:00 AM  Attestation of Supervision of Resident:  I confirm that I have verified the information documented in the resident's note and that I have also personally reperformed the history, physical exam and all medical decision making activities.  I have verified that all services and findings are accurately documented in this resident's note; and I agree with management and plan as outlined in the documentation. I have also made any necessary editorial changes.  VLajean Manes CTennysonfor [facility] 11/14/2019 9:01 AM

## 2019-11-15 NOTE — Plan of Care (Signed)
  Problem: Pain Managment: Goal: General experience of comfort will improve Outcome: Progressing Note: Pt pain has been managed adequately by scheduled Motrin.   Problem: Activity: Goal: Will verbalize the importance of balancing activity with adequate rest periods Outcome: Progressing Note: Infant taken to nursery x for mom to rest this afternoon.   Problem: Life Cycle: Goal: Chance of risk for complications during the postpartum period will decrease Outcome: Progressing Note: Fundas firm, lochia WNL & without clots.   Problem: Role Relationship: Goal: Ability to demonstrate positive interaction with newborn will improve Outcome: Progressing

## 2019-11-15 NOTE — Discharge Summary (Signed)
Postpartum Discharge Summary       Patient Name: Shelby Orr DOB: [birth year] MRN: [id number]  Date of admission: 11/14/2019 Delivering Provider: Marcille Buffy D   Date of discharge: 11/16/2019  Admitting diagnosis: Indication for care in labor or delivery [O75.9] Intrauterine pregnancy: [redacted]w[redacted]d    Secondary diagnosis:  Active Problems:   Indication for care in labor or delivery  Additional problems: None     Discharge diagnosis: Term Pregnancy Delivered                                                                                                Post partum procedures:NA  Augmentation: Pitocin  Complications: None  Hospital course:  Onset of Labor With Vaginal Delivery     29y.o. yo [phone] 367w2das admitted in Active Labor on 11/14/2019. Patient had an uncomplicated labor course as follows:  Membrane Rupture Time/Date: 9:12 AM ,11/14/2019   Intrapartum Procedures: Episiotomy: None [1]                                         Lacerations:  None [1]  Patient had a delivery of a Viable infant. 11/14/2019  Information for the patient's newborn:  IdNelani, Schmelzleirl KuRhena0[[id number]]Delivery Method: Vag-Spont     Pateint had an uncomplicated postpartum course.  She is ambulating, tolerating a regular diet, passing flatus, and urinating well. Patient is discharged home in stable condition on 11/16/19.  Delivery time: 9:15 AM    Magnesium Sulfate received: No BMZ received: No Rhophylac:N/A MMR:N/A Transfusion:No  Physical exam  Vitals:   11/15/19 0500 11/15/19 1548 11/15/19 2112 11/16/19 0535  BP: 113/64 114/69 112/66 (!) 102/45  Pulse: (!) 53 (!) 57 71 (!) 54  Resp: 16 18 16 16   Temp: 98.2 F (36.8 C) 98.1 F (36.7 C) 99.4 F (37.4 C) 98.3 F (36.8 C)  TempSrc: Oral Oral Oral Oral  SpO2: 100% 100% 100% 100%  Weight:      Height:       General: alert and no distress Lochia: appropriate Uterine Fundus: firm Incision: N/A DVT Evaluation: No evidence of DVT seen  on physical exam. No cords or calf tenderness. Labs: Lab Results  Component Value Date   WBC 10.6 (H) 11/14/2019   HGB 11.0 (L) 11/14/2019   HCT 35.3 (L) 11/14/2019   MCV 92.7 11/14/2019   PLT 308 11/14/2019   No flowsheet data found. Edinburgh Score: Edinburgh Postnatal Depression Scale Screening Tool 11/15/2019  I have been able to laugh and see the funny side of things. 0  I have looked forward with enjoyment to things. 0  I have blamed myself unnecessarily when things went wrong. 2  I have been anxious or worried for no good reason. 2  I have felt scared or panicky for no good reason. 1  Things have been getting on top of me. 1  I have been so unhappy that I have had difficulty sleeping. 1  I have felt sad or  miserable. 1  I have been so unhappy that I have been crying. 1  The thought of harming myself has occurred to me. 0  Edinburgh Postnatal Depression Scale Total 9    Discharge instruction: per After Visit Summary and "Baby and Me Booklet".  After visit meds:  Allergies as of 11/16/2019   No Known Allergies     Medication List    STOP taking these medications   Blood Pressure Kit Chemical engineer Maternity Supp Med Misc   PRENATAL VITAMIN AND MINERAL PO       Diet: No evidence of DVT seen on physical exam. No cords or calf tenderness.  Activity: Advance as tolerated. Pelvic rest for 6 weeks.   Outpatient follow up:6 weeks Follow up Appt: Future Appointments  Date Time Provider [place]  12/13/2019  2:00 PM Lajean Manes, CNM [place] None   Follow up Visit: [place] Follow up on 12/13/2019.   Specialty: Obstetrics and Gynecology Why: for postpartum appointment Contact information: [address] [place] [phone]            Please schedule this patient for Postpartum visit in: 4 weeks with the following provider: Any  provider In-Person For C/S patients schedule nurse incision check in weeks 2 weeks: no Low risk pregnancy complicated by: none Delivery mode:  SVD Anticipated Birth Control:  IUD PP Procedures needed: none  Schedule Integrated BH visit: no     Newborn Data: Live born female  Birth Weight:   APGAR: 96, 9  Newborn Delivery   Birth date/time: 11/14/2019 09:15:00 Delivery type: Vaginal, Spontaneous      Baby Feeding: Breast Disposition:home with mother

## 2019-11-15 NOTE — Lactation Note (Signed)
This note was copied from a baby's chart. Lactation Consultation Note  Patient Name: Girl Ylonda Storr PNDLO'P Date: 11/15/2019 Reason for consult: Follow-up assessment  P4 mother whose infant is now 51 hours old.  This is a term baby at 39+2 weeks.  Mother has breast feeding experience.  Baby was asleep when I arrived.  Mother had no questions/concerns related to breast feeding.  Her nipples are tender so I provided coconut oil for comfort.  Suggested she also use EBM to rub into nipples/areolas for comfort.  LATCH scores have been 8-9 and the last bowel movement was brown in color.  Encouraged mother to continue feeding 8-12 times/24 hours or sooner if she shows feeding cues.  Discussed cluster feeding for tonight.  Asked mother to call her RN/LC for latch assistance as needed.    Mother has a DEBP for home use.  No support person here at this time.   Maternal Data    Feeding Feeding Type: Breast Fed  LATCH Score                   Interventions    Lactation Tools Discussed/Used     Consult Status Consult Status: Follow-up Date: 11/16/19 Follow-up type: In-patient    Dora Sims 11/15/2019, 5:46 PM

## 2019-11-15 NOTE — Plan of Care (Signed)
  Problem: Activity: Goal: Risk for activity intolerance will decrease Outcome: Progressing   Problem: Pain Managment: Goal: General experience of comfort will improve Outcome: Progressing   Problem: Skin Integrity: Goal: Risk for impaired skin integrity will decrease Outcome: Progressing   Problem: Activity: Goal: Will verbalize the importance of balancing activity with adequate rest periods Outcome: Progressing

## 2019-11-15 NOTE — Progress Notes (Signed)
Post Partum Day 1 Subjective: no complaints, up ad lib, voiding and tolerating PO  Objective: Blood pressure 113/64, pulse (!) 53, temperature 98.2 F (36.8 C), temperature source Oral, resp. rate 16, height 5\' 5"  (1.651 m), weight 68 kg, last menstrual period 02/12/2019, SpO2 100 %, unknown if currently breastfeeding.  Physical Exam:  General: alert, cooperative and appears stated age 29: appropriate Uterine Fundus: firm Incision: n/a DVT Evaluation: No evidence of DVT seen on physical exam.  Recent Labs    11/14/19 0410  HGB 11.0*  HCT 35.3*    Assessment/Plan: Plan for discharge tomorrow  Baby to be discharged tomorrow, patient would like to go home when baby does   LOS: 1 day   01/14/20 11/15/2019, 3:43 PM

## 2019-11-16 ENCOUNTER — Encounter: Payer: Medicaid Other | Admitting: Women's Health

## 2019-11-16 NOTE — Lactation Note (Signed)
This note was copied from a baby's chart. Lactation Consultation Note  Patient Name: Shelby Orr ALPFX'T Date: 11/16/2019 Reason for consult: Follow-up assessment;Infant weight loss Baby is 48 hours old/9% weight loss.  Mom is currently breastfeeding in cradle hold.  Baby is wrapped in blanket.  Latch looks good but baby is doing non-nutritive sucking.  Mom is using breast compression and massage during feeding.  Explained that baby's often feed better skin to skin.  Instructed on waking techniques and baby responded to stimulation.  Mom's breasts are becoming full.  Instructed on hand expression and colostrum easily expressed into colostrum container.  Baby spoon fed 5 mls of colostrum.  Observed mom easily latch baby to left breast using cradle hold.  Baby latched with ease.  Mom encouraged to stimulate baby during feeding.  Mom has a breast pump at home.  Recommended hand expression and post pumping to stimulate supply and provide additional calories.  Questions answered.  Reviewed engorgement treatment.  Maternal Data    Feeding Feeding Type: Breast Fed  LATCH Score Latch: Grasps breast easily, tongue down, lips flanged, rhythmical sucking.  Audible Swallowing: A few with stimulation  Type of Nipple: Everted at rest and after stimulation  Comfort (Breast/Nipple): Soft / non-tender  Hold (Positioning): No assistance needed to correctly position infant at breast.  LATCH Score: 9  Interventions Interventions: Hand express  Lactation Tools Discussed/Used     Consult Status Consult Status: Complete Follow-up type: Call as needed    [name] 11/16/2019, 9:53 AM

## 2019-11-17 ENCOUNTER — Ambulatory Visit: Payer: Self-pay

## 2019-11-17 NOTE — Lactation Note (Signed)
This note was copied from a baby's chart. Lactation Consultation Note:  Mother is a P4 , infant is hours old and is now at 6% wt loss infant has been supplemented with formula. Mother has been breastfeeding . Mothers breast are very firm and full . She was extremely tender and having difficulty latching infant when I arrive in the room.. Mother was given a harmony hand pump with instructions. Mothers nipples are erect with areola compressible breast tissue. No  Mother was observed with infant latched on at the left breast. Observed infant suckling with audible swallows. Infant sustained latch for . Placed infant in football hold to allow for drainage of ducts under mothers arms. Good breast massage preformed.  Mother was given ice packs and comfort gels.  Infant placed on alternate breast,.  Encouraged to follow plan.   Plan of Care : Breastfeed infant with feeding cues Supplement infant with ebm, Pump using a DEBPor hand pump after each feeding for 15-20 mins.   Mother to continue to cue base feed infant and feed at least 8-12 times or more in 24 hours and advised to allow for cluster feeding infant as needed.   Mother to continue to due STS. Mother is aware of available LC services at [facility], BFSG'S, OP Dept, and phone # for questions or concerns about breastfeeding.  Mother receptive to all teaching and plan of care.    Patient Name: Girl Apollonia Amini JJHER'D Date: 11/17/2019 Reason for consult: Follow-up assessment   Maternal Data    Feeding Feeding Type: Breast Fed  LATCH Score Latch: Grasps breast easily, tongue down, lips flanged, rhythmical sucking.  Audible Swallowing: Spontaneous and intermittent  Type of Nipple: Everted at rest and after stimulation  Comfort (Breast/Nipple): Engorged, cracked, bleeding, large blisters, severe discomfort(lt breast  softening)  Hold (Positioning): Assistance needed to correctly position infant at breast and maintain latch.  LATCH  Score: 7  Interventions Interventions: Breast massage  Lactation Tools Discussed/Used Tools: Comfort gels Pump Review: Setup, frequency, and cleaning;Milk Storage   Consult Status Consult Status: Complete    Michel Bickers 11/17/2019, 10:13 AM

## 2019-12-13 ENCOUNTER — Ambulatory Visit: Payer: Medicaid Other | Admitting: Certified Nurse Midwife

## 2020-01-03 ENCOUNTER — Encounter: Payer: Self-pay | Admitting: Obstetrics & Gynecology

## 2020-01-03 ENCOUNTER — Ambulatory Visit (HOSPITAL_BASED_OUTPATIENT_CLINIC_OR_DEPARTMENT_OTHER): Payer: Medicaid Other | Admitting: Obstetrics & Gynecology

## 2020-01-03 ENCOUNTER — Other Ambulatory Visit: Payer: Self-pay

## 2020-01-03 DIAGNOSIS — Z30430 Encounter for insertion of intrauterine contraceptive device: Secondary | ICD-10-CM

## 2020-01-03 DIAGNOSIS — Z1389 Encounter for screening for other disorder: Secondary | ICD-10-CM

## 2020-01-03 LAB — POCT URINE PREGNANCY: Preg Test, Ur: NEGATIVE

## 2020-01-03 MED ORDER — LEVONORGESTREL 19.5 MCG/DAY IU IUD: INTRAUTERINE_SYSTEM | Freq: Once | INTRAUTERINE | Status: AC

## 2020-01-03 NOTE — Patient Instructions (Signed)
Intrauterine Device Insertion, Care After  This sheet gives you information about how to care for yourself after your procedure. Your health care provider may also give you more specific instructions. If you have problems or questions, contact your health care provider. What can I expect after the procedure? After the procedure, it is common to have:  Cramps and pain in the abdomen.  Light bleeding (spotting) or heavier bleeding that is like your menstrual period. This may last for up to a few days.  Lower back pain.  Dizziness.  Headaches.  Nausea. Follow these instructions at home:  Before resuming sexual activity, check to make sure that you can feel the IUD string(s). You should be able to feel the end of the string(s) below the opening of your cervix. If your IUD string is in place, you may resume sexual activity. ? If you had a hormonal IUD inserted more than 7 days after your most recent period started, you will need to use a backup method of birth control for 7 days after IUD insertion. Ask your health care provider whether this applies to you.  Continue to check that the IUD is still in place by feeling for the string(s) after every menstrual period, or once a month.  Take over-the-counter and prescription medicines only as told by your health care provider.  Do not drive or use heavy machinery while taking prescription pain medicine.  Keep all follow-up visits as told by your health care provider. This is important. Contact a health care provider if:  You have bleeding that is heavier or lasts longer than a normal menstrual cycle.  You have a fever.  You have cramps or abdominal pain that get worse or do not get better with medicine.  You develop abdominal pain that is new or is not in the same area of earlier cramping and pain.  You feel lightheaded or weak.  You have abnormal or bad-smelling discharge from your vagina.  You have pain during sexual  activity.  You have any of the following problems with your IUD string(s): ? The string bothers or hurts you or your sexual partner. ? You cannot feel the string. ? The string has gotten longer.  You can feel the IUD in your vagina.  You think you may be pregnant, or you miss your menstrual period.  You think you may have an STI (sexually transmitted infection). Get help right away if:  You have flu-like symptoms.  You have a fever and chills.  You can feel that your IUD has slipped out of place. Summary  After the procedure, it is common to have cramps and pain in the abdomen. It is also common to have light bleeding (spotting) or heavier bleeding that is like your menstrual period.  Continue to check that the IUD is still in place by feeling for the string(s) after every menstrual period, or once a month.  Keep all follow-up visits as told by your health care provider. This is important.  Contact your health care provider if you have problems with your IUD string(s), such as the string getting longer or bothering you or your sexual partner. This information is not intended to replace advice given to you by your health care provider. Make sure you discuss any questions you have with your health care provider. Document Revised: 07/11/2017 Document Reviewed: 06/19/2016 Elsevier Patient Education  2020 Elsevier Inc.  

## 2020-01-03 NOTE — Progress Notes (Signed)
Post Partum Visit Note  Shelby Orr is a 29 y.o. G102P4004 female who presents for a postpartum visit. She is 7 weeks postpartum following a vaginal delivery.  I have fully reviewed the prenatal and intrapartum course. The delivery was at 39.2 gestational weeks.  Anesthesia: Epidural. Postpartum course has been uncomplicated. Baby is doing well. Baby is feeding by breast. Bleeding not at this time. Bowel function is not at this time. Bladder function is . Patient not sexually active. Desired contraception method is IUD. Postpartum depression screening: negative.  The following portions of the patient's history were reviewed and updated as appropriate: allergies, current medications, past family history, past medical history, past social history, past surgical history and problem list. Normal pap and negative HPV on 06/11/2019.  Review of Systems Pertinent items noted in HPI and remainder of comprehensive ROS otherwise negative.    Objective:  Blood pressure 125/77, pulse (!) 55, weight 144 lb 8 oz (65.5 kg), last menstrual period 02/12/2019, unknown if currently breastfeeding.  General:  alert and no distress   Breasts:  inspection negative, no nipple discharge or bleeding, no masses or nodularity palpable  Lungs: clear to auscultation bilaterally  Heart:  regular rate and rhythm  Abdomen: soft, non-tender; bowel sounds normal; no masses,  no organomegaly   Vulva:  normal  Vagina: normal vagina  Cervix:  multiparous appearance and no cervical motion tenderness  Corpus: normal size, contour, position, consistency, mobility, non-tender  Adnexa:  not evaluated  Rectal Exam: Not performed.         IUD Insertion Procedure Note Patient identified, informed consent performed, consent signed.   Discussed risks of irregular bleeding, cramping, infection, malpositioning or misplacement of the IUD outside the uterus which may require further procedure such as laparoscopy. Also discussed >99%  contraception efficacy, increased risk of ectopic pregnancy with failure of method.  Time out was performed.  Urine pregnancy test negative.  Speculum placed in the vagina.  Cervix visualized.  Cleaned with Betadine x 2.  Grasped anteriorly with a single tooth tenaculum.  Uterus sounded to 9 cm.  Liletta IUD placed per manufacturer's recommendations.  Strings trimmed to 3 cm. Tenaculum was removed, good hemostasis noted.  Patient tolerated procedure well.   Patient was given post-procedure instructions.  She was advised to have backup contraception for one week.  Patient was also asked to check IUD strings periodically and follow up in 4-6 weeks for IUD check.   Assessment:   Normal postpartum exam. Liletta IUD inserted today.   Plan:   Essential components of care per ACOG recommendations:  1.  Mood and well being: Patient with negative depression screening today. Reviewed local resources for support.  - Patient does not use tobacco or drugs.  2. Infant care and feeding:  -Patient currently breastmilk feeding? Yes. Discussed return to work and pumping. Reviewed importance of draining breast regularly to support lactation. -Social determinants of health (SDOH) reviewed in EPIC. No concerns  3. Sexuality, contraception and birth spacing - Patient does not want a pregnancy in the next year.  Desired family size is 4 children.  - Reviewed forms of contraception in tiered fashion. Liletta IUD placed today, this can be in place for up to seven years   - Discussed birth spacing of 18 months  4. Sleep and fatigue -Encouraged family/partner/community support of 4 hrs of uninterrupted sleep to help with mood and fatigue  5. Physical Recovery  - Discussed patients delivery - Patient has urinary incontinence? No -  Patient is safe to resume physical and sexual activity  6.  Health Maintenance - Last pap smear done 06/11/2019 and was normal with negative HPV.  Follow up for IUD check in 4-6  weeks.  Verita Schneiders, MD [facility] [employer], [place]

## 2020-02-07 ENCOUNTER — Encounter: Payer: Self-pay | Admitting: Obstetrics and Gynecology

## 2020-02-07 ENCOUNTER — Other Ambulatory Visit: Payer: Self-pay

## 2020-02-07 ENCOUNTER — Ambulatory Visit (INDEPENDENT_AMBULATORY_CARE_PROVIDER_SITE_OTHER): Payer: Medicaid Other | Admitting: Obstetrics and Gynecology

## 2020-02-07 VITALS — BP 122/77 | HR 67 | Wt 141.4 lb

## 2020-02-07 DIAGNOSIS — Z30431 Encounter for routine checking of intrauterine contraceptive device: Secondary | ICD-10-CM

## 2020-02-07 NOTE — Progress Notes (Signed)
29 yo P4 here for IUD check. Patient had IUD inserted on 01/03/20. She reports some occasional vaginal spotting. She states that her partner can feel the strings. She denies pelvic pain  Past Medical History:  Diagnosis Date  . Headache    Past Surgical History:  Procedure Laterality Date  . NO PAST SURGERIES     Family History  Problem Relation Age of Onset  . Stroke Mother   . Hypertension Father   . Diabetes Paternal Grandfather    Social History   Tobacco Use  . Smoking status: Never Smoker  . Smokeless tobacco: Never Used  Vaping Use  . Vaping Use: Never used  Substance Use Topics  . Alcohol use: No  . Drug use: No   ROS See pertinent in HPI  Blood pressure 122/77, pulse 67, weight 141 lb 6.4 oz (64.1 kg), unknown if currently breastfeeding. GENERAL: Well-developed, well-nourished female in no acute distress.  ABDOMEN: Soft, nontender, nondistended. No organomegaly. PELVIC: Normal external female genitalia. Vagina is pink and rugated.  Normal discharge. Normal appearing cervix with IUD strings extending 3 cm from os. Strings were trimmed. Uterus is normal in size. No adnexal mass or tenderness. EXTREMITIES: No cyanosis, clubbing, or edema, 2+ distal pulses.  A/P 29 yo here for IUD check - Strings trimmed - IUD appears to be in the appropriate location - RTC prn

## 2021-09-10 IMAGING — US US MFM OB COMP +14 WKS
1 series · 12 of 28 positions shown · non-contrast
Comparison: none

[Series 1: us mfm ob comp +14 wks · 95 acquisitions, 12 frames shown]
[im 4/95]
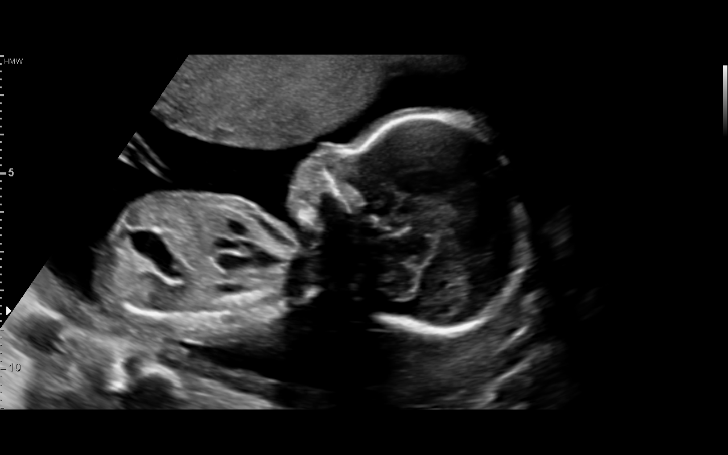
[im 11/95]
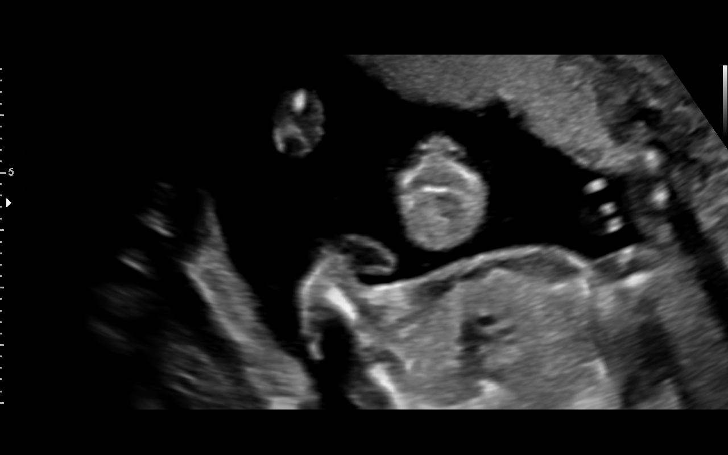
[im 18/95]
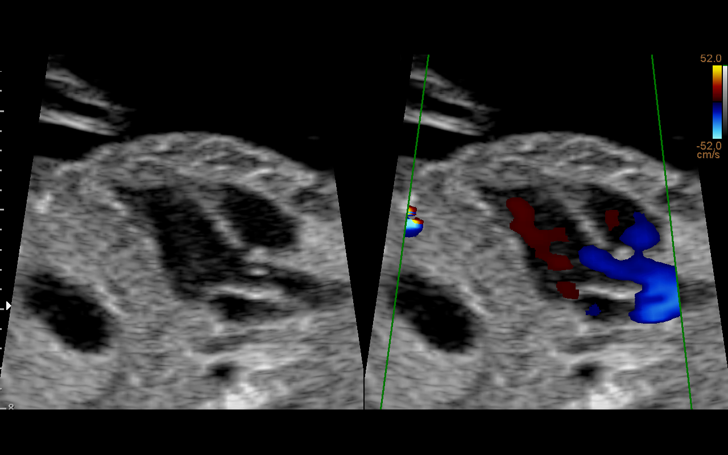
[im 28/95]
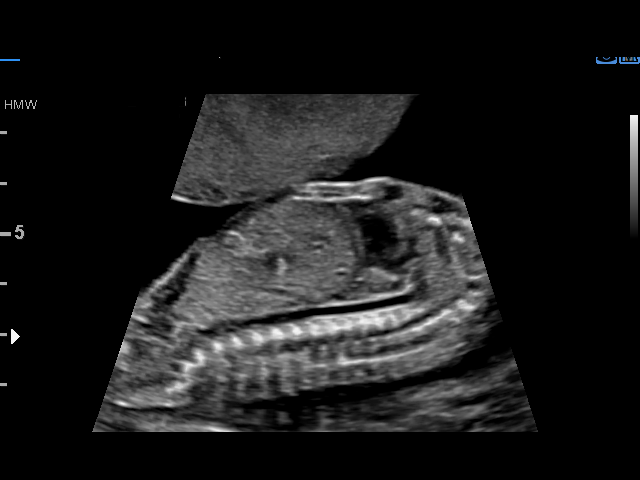
[im 35/95]
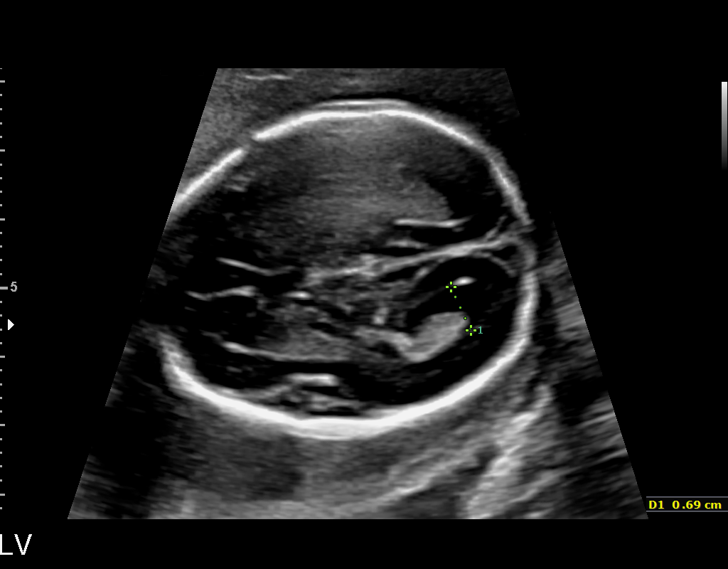
[im 42/95]
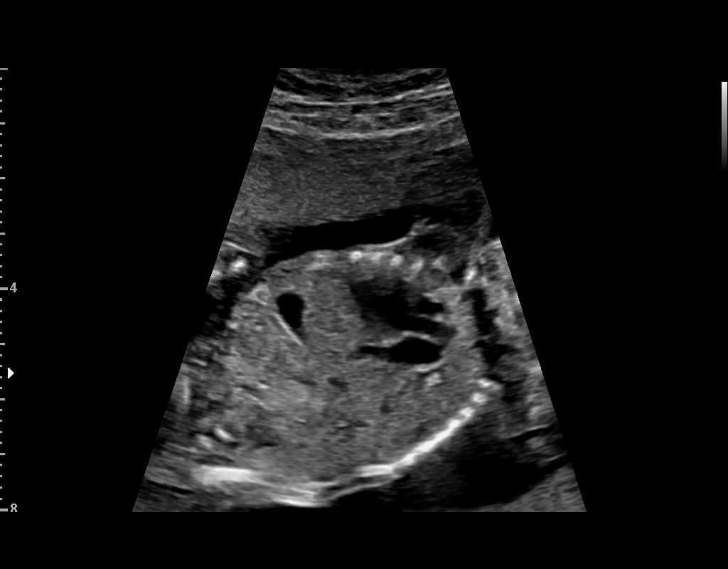
[im 53/95]
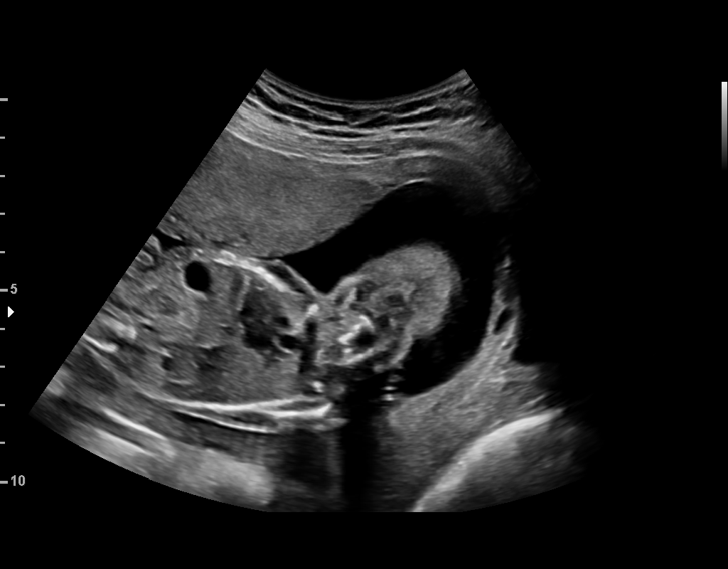
[im 60/95]
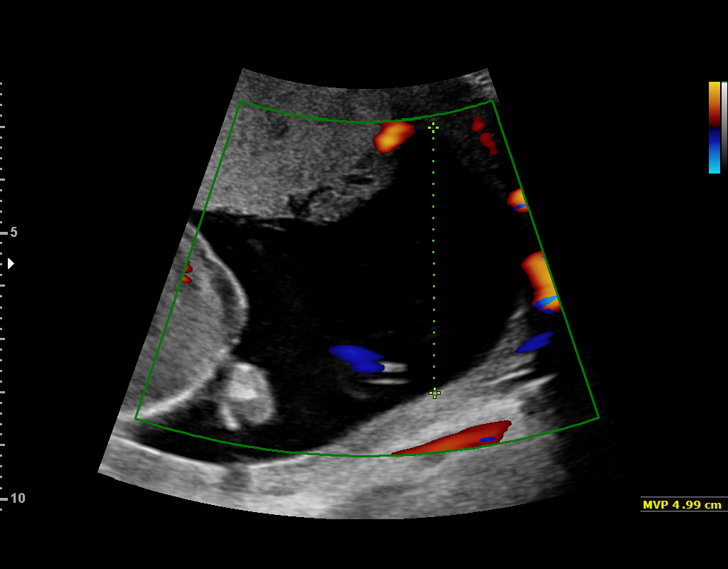
[im 67/95]
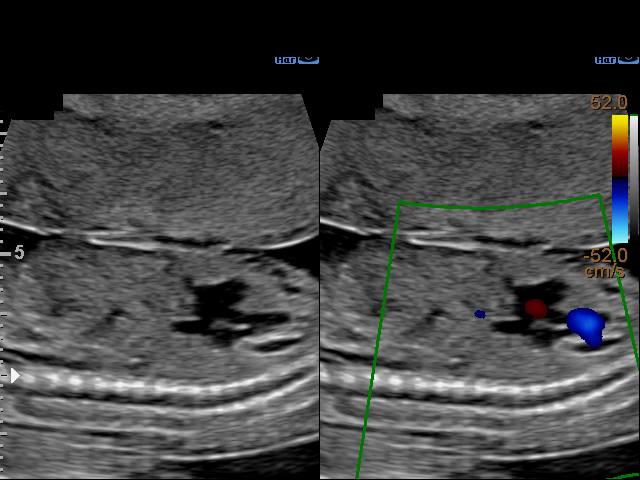
[im 77/95]
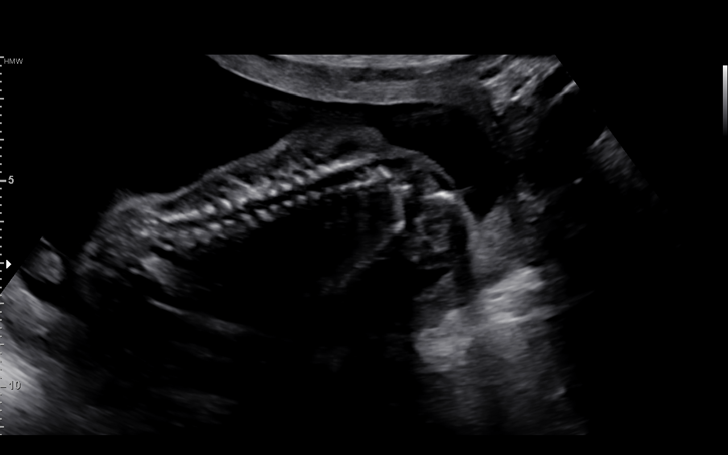
[im 84/95]
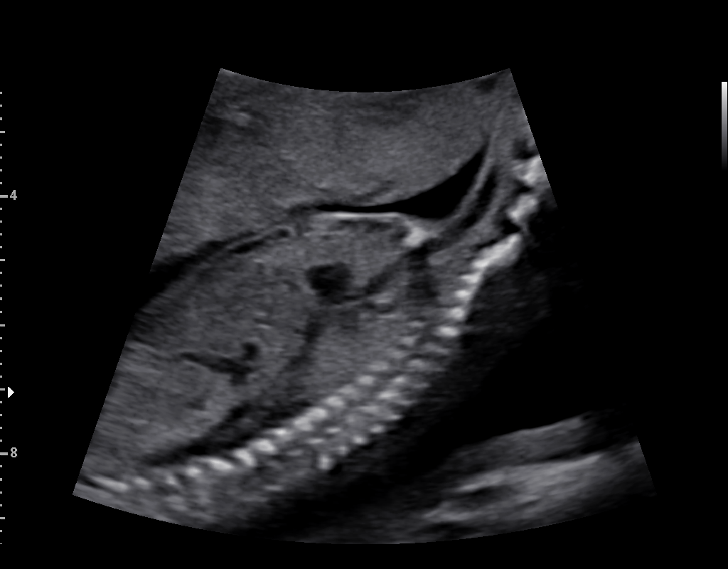
[im 91/95]
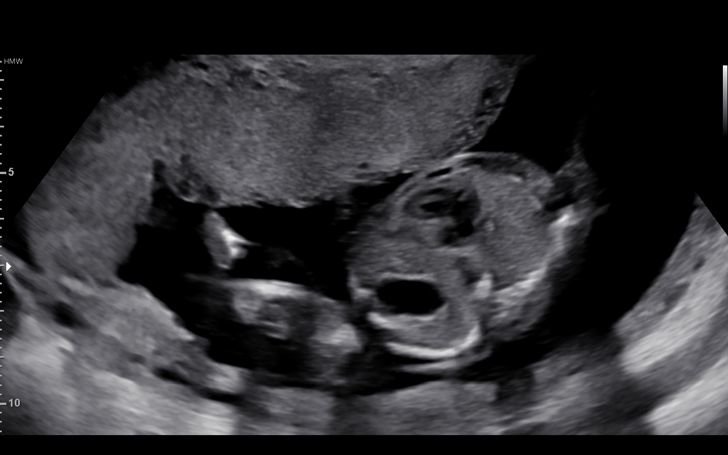

[12 of 28 positions shown; findings below may reference images not displayed]

1  US MFM OB COMP + 14 WK               76805.01     REALMADRID NIIKO
 ----------------------------------------------------------------------

 ----------------------------------------------------------------------
Indications

  Encounter for antenatal screening for
  malformations (low risk NIPS, Q.ZKK)
  19 weeks gestation of pregnancy
 ----------------------------------------------------------------------
Fetal Evaluation

 Num Of Fetuses:         1
 Cardiac Activity:       Observed
 Presentation:           Cephalic
 Placenta:               Anterior
 P. Cord Insertion:      Visualized, central

 Amniotic Fluid
 AFI FV:      Within normal limits

                             Largest Pocket(cm)

Biometry

 BPD:      45.3  mm     G. Age:  19w 5d         79  %    CI:        75.36   %    70 - 86
                                                         FL/HC:      19.2   %    16.1 -
 HC:      165.5  mm     G. Age:  19w 2d         55  %    HC/AC:      1.23        1.09 -
 AC:      134.9  mm     G. Age:  19w 0d         44  %    FL/BPD:     70.0   %
 FL:       31.7  mm     G. Age:  19w 6d         74  %    FL/AC:      23.5   %    20 - 24
 HUM:      30.1  mm     G. Age:  20w 0d         74  %
 CER:      18.8  mm     G. Age:  18w 3d         32  %
 CM:        6.3  mm

 Est. FW:     289  gm    0 lb 10 oz      68  %
OB History

 Gravidity:    4         Term:   3        Prem:   0        SAB:   0
 TOP:          0       Ectopic:  0        Living: 3
Gestational Age

 LMP:           19w 0d        Date:  02/12/19                 EDD:   11/19/19
 U/S Today:     19w 3d                                        EDD:   11/16/19
 Best:          19w 0d     Det. By:  LMP  (02/12/19)          EDD:   11/19/19
Anatomy

 Cranium:               Appears normal         Aortic Arch:            Appears normal
 Cavum:                 Appears normal         Ductal Arch:            Appears normal
 Ventricles:            Appears normal         Diaphragm:              Appears normal
 Choroid Plexus:        Appears normal         Stomach:                Appears normal, left
                                                                       sided
 Cerebellum:            Appears normal         Abdomen:                Appears normal
 Posterior Fossa:       Appears normal         Abdominal Wall:         Appears nml (cord
                                                                       insert, abd wall)
 Nuchal Fold:           Appears normal         Cord Vessels:           Appears normal (3
                                                                       vessel cord)
 Face:                  Appears normal         Kidneys:                Appear normal
                        (orbits and profile)
 Lips:                  Appears normal         Bladder:                Appears normal
 Thoracic:              Appears normal         Spine:                  Not well visualized
 Heart:                 Appears normal         Upper Extremities:      Appears normal
                        (4CH, axis, and
                        situs)
 RVOT:                  Appears normal         Lower Extremities:      Appears normal
 LVOT:                  Appears normal

 Other:  Fetus appears to be female. Heels visualized. Technically difficult due
         to fetal position.
Cervix Uterus Adnexa

 Cervix
 Length:            3.3  cm.
 Normal appearance by transabdominal scan.

 Uterus
 No abnormality visualized.

 Left Ovary
 No adnexal mass visualized.

 Right Ovary
 No adnexal mass visualized.

 Cul De Sac
 No free fluid seen.

 Adnexa
 No abnormality visualized.
Comments

 This patient was seen for a detailed fetal anatomy scan.
 She denies any significant past medical history and denies
 any problems in her current pregnancy.
 She had a cell free DNA test earlier in her pregnancy which
 indicated a low risk for trisomy 21, 18, and 13. A female fetus
 is predicted.
 She was informed that the fetal growth and amniotic fluid
 level were appropriate for her gestational age.
 There were no obvious fetal anomalies noted on today's
 ultrasound exam.
 The patient was informed that anomalies may be missed due
 to technical limitations. If the fetus is in a suboptimal position
 or maternal habitus is increased, visualization of the fetus in
 the maternal uterus may be impaired.
 Follow up as indicated.

## 2022-08-22 ENCOUNTER — Ambulatory Visit: Payer: Medicaid Other | Admitting: Advanced Practice Midwife

## 2022-10-04 ENCOUNTER — Encounter: Payer: Self-pay | Admitting: Medical

## 2022-10-04 ENCOUNTER — Ambulatory Visit (INDEPENDENT_AMBULATORY_CARE_PROVIDER_SITE_OTHER): Payer: Medicaid Other | Admitting: Medical

## 2022-10-04 ENCOUNTER — Other Ambulatory Visit (HOSPITAL_COMMUNITY)
Admission: RE | Admit: 2022-10-04 | Discharge: 2022-10-04 | Disposition: A | Discharge status: Home / Self Care | Payer: Medicaid Other | Source: Ambulatory Visit | Attending: Medical | Admitting: Medical

## 2022-10-04 VITALS — BP 131/80 | HR 55 | Ht 65.0 in | Wt 143.2 lb

## 2022-10-04 DIAGNOSIS — Z124 Encounter for screening for malignant neoplasm of cervix: Secondary | ICD-10-CM

## 2022-10-04 DIAGNOSIS — Z1159 Encounter for screening for other viral diseases: Secondary | ICD-10-CM | POA: Diagnosis not present

## 2022-10-04 DIAGNOSIS — Z30432 Encounter for removal of intrauterine contraceptive device: Secondary | ICD-10-CM | POA: Diagnosis not present

## 2022-10-04 NOTE — Progress Notes (Signed)
History:  Shelby Orr is a 32 y.o. [phone] who presents to clinic today for annual exam with pap smear. Also desires IUD removal at this time. She does not like the way it makes her feel. She has very light periods with the IUD currently. She plans to use condoms for birth control. She denies bleeding, abnormal discharge, pain, UTI symptoms, breast concerns and N/V/D or constipation today.  She is sexually active with one partner. Last pap smear as normal 05/2019. She declines STD testing today, but is agreeable to a routine Hep C screening. She has also declined the TDAP today.   The following portions of the patient's history were reviewed and updated as appropriate: allergies, current medications, family history, past medical history, social history, past surgical history and problem list.  Review of Systems:  Review of Systems  Constitutional:  Negative for fever and malaise/fatigue.  Gastrointestinal:  Negative for abdominal pain, constipation, diarrhea, nausea and vomiting.  Genitourinary:  Negative for dysuria, frequency and urgency.       Neg - vaginal bleeding, discharge, pelvic pain      Objective:  Physical Exam BP 131/80   Pulse (!) 55   Ht '5\' 5"'$  (1.651 m)   Wt 143 lb 3.2 oz (65 kg)   LMP 09/15/2022 Comment: IUD  BMI 23.83 kg/m  Physical Exam Vitals and nursing note reviewed. Exam conducted with a chaperone present.  Constitutional:      General: She is not in acute distress.    Appearance: Normal appearance. She is well-developed. She is obese.  HENT:     Head: Normocephalic and atraumatic.  Neck:     Thyroid: No thyromegaly.  Cardiovascular:     Rate and Rhythm: Normal rate and regular rhythm.     Heart sounds: No murmur heard. Pulmonary:     Effort: Pulmonary effort is normal. No respiratory distress.     Breath sounds: Normal breath sounds. No wheezing.  Chest:  Breasts:    Breasts are symmetrical.     Right: No swelling, bleeding, inverted nipple,  mass, nipple discharge, skin change or tenderness.     Left: No swelling, bleeding, inverted nipple, mass, nipple discharge, skin change or tenderness.  Abdominal:     General: Abdomen is flat. Bowel sounds are normal. There is no distension.     Palpations: Abdomen is soft. There is no mass.     Tenderness: There is no abdominal tenderness. There is no guarding or rebound.  Genitourinary:    General: Normal vulva.     Vagina: Vaginal discharge (small mucous) present. No erythema, tenderness or bleeding.     Cervix: No cervical motion tenderness, discharge, friability, lesion, erythema or cervical bleeding.     Uterus: Not enlarged and not tender.      Adnexa:        Right: No mass or tenderness.         Left: No mass or tenderness.    Musculoskeletal:     Cervical back: Neck supple.  Skin:    General: Skin is warm and dry.     Findings: No erythema.  Neurological:     Mental Status: She is alert and oriented to person, place, and time.  Psychiatric:        Mood and Affect: Mood normal.      Health Maintenance Due  Topic Date Due   Hepatitis C Screening  Never done   INFLUENZA VACCINE  Never done   COVID-19 Vaccine (3 -  2023-24 season) 04/12/2022   PAP SMEAR-Modifier  06/10/2022    Labs, imaging and previous visits in Epic and Care Everywhere reviewed  [place]  IUD Removal  Patient was in the dorsal lithotomy position, normal external genitalia was noted.  A speculum was placed in the patient's vagina, normal discharge was noted, no lesions. The multiparous cervix was visualized, no lesions, no abnormal discharge.  The strings of the IUD were grasped and pulled using kelly forceps. The IUD was removed in its entirety. Patient tolerated the procedure well.     Assessment & Plan:  1. Cervical cancer screening - Cytology - PAP( [place]) - Results will be shared via MyChart   2. Encounter for IUD removal - Removed without issue, patient  advised of expected return of periods and fertility   3. Need for hepatitis C screening test - Hepatitis C Antibody   Approximately 20 minutes of total time was spent with this patient on history taking, patient education, coordination of care, physical exam and documentation.   Luvenia Redden, PA-C 10/04/2022 8:39 AM

## 2022-10-04 NOTE — Progress Notes (Signed)
Pt presents for AEX. Pt requesting IUD removal. Declined other BC method. Decline STD testing. No other concerns.

## 2022-10-05 LAB — HEPATITIS C ANTIBODY: Hep C Virus Ab: NONREACTIVE

## 2022-10-07 LAB — CYTOLOGY - PAP
Comment: NEGATIVE
Diagnosis: NEGATIVE
High risk HPV: NEGATIVE

## 2022-12-18 ENCOUNTER — Ambulatory Visit (INDEPENDENT_AMBULATORY_CARE_PROVIDER_SITE_OTHER): Payer: Medicaid Other

## 2022-12-18 VITALS — BP 100/64 | HR 74 | Ht 65.0 in | Wt 149.3 lb

## 2022-12-18 DIAGNOSIS — Z3201 Encounter for pregnancy test, result positive: Secondary | ICD-10-CM

## 2022-12-18 DIAGNOSIS — Z111 Encounter for screening for respiratory tuberculosis: Secondary | ICD-10-CM

## 2022-12-18 DIAGNOSIS — N912 Amenorrhea, unspecified: Secondary | ICD-10-CM | POA: Diagnosis not present

## 2022-12-18 LAB — POCT URINE PREGNANCY: Preg Test, Ur: POSITIVE — AB

## 2022-12-18 MED ORDER — VITAFOL ULTRA 29-0.6-0.4-200 MG PO CAPS: 1.0000 | ORAL_CAPSULE | Freq: Every day | ORAL | 11 refills | Status: AC

## 2022-12-18 NOTE — Progress Notes (Signed)
Shelby Orr presents today for UPT. She has no unusual complaints. LMP: 11/22/22. Patient is 110w5d EDD 08/29/23    OBJECTIVE: Appears well, in no apparent distress.  OB History     Gravida  4   Para  4   Term  4   Preterm      AB      Living  4      SAB      IAB      Ectopic      Multiple  0   Live Births  4          Home UPT Result: Positive In-Office UPT result: Positive I have reviewed the patient's medical, obstetrical, social, and family histories, and medications.   ASSESSMENT: Positive pregnancy test  PLAN Prenatal care to be completed at: [facility] New OB Intake New OB Provider visit    Patient request letter stating that she can not have a chest x ray for TB screening. She states that she has an allergy to PPD skin test. Per Dr. Berton Lan patient to have quantifereon drawn. Results needed for work.

## 2022-12-24 ENCOUNTER — Other Ambulatory Visit: Payer: Self-pay | Admitting: *Deleted

## 2022-12-24 LAB — QUANTIFERON-TB GOLD PLUS
QuantiFERON Mitogen Value: >10 IU/mL
QuantiFERON Nil Value: 0.13 IU/mL
QuantiFERON TB1 Ag Value: 5.5 IU/mL
QuantiFERON TB2 Ag Value: 4.8 IU/mL
QuantiFERON-TB Gold Plus: POSITIVE — AB

## 2022-12-24 NOTE — Progress Notes (Signed)
TC. Advised of need for CXR and that abdomen would be shielded. Will coordinate order with Dr. Berton Lan and Referral Coordinator will schedule was ordered.

## 2022-12-25 ENCOUNTER — Ambulatory Visit (HOSPITAL_COMMUNITY)
Admission: RE | Admit: 2022-12-25 | Discharge: 2022-12-25 | Disposition: A | Discharge status: Home / Self Care | Payer: Medicaid Other | Source: Ambulatory Visit | Attending: Obstetrics and Gynecology | Admitting: Obstetrics and Gynecology

## 2022-12-25 ENCOUNTER — Other Ambulatory Visit: Payer: Self-pay | Admitting: Emergency Medicine

## 2022-12-25 DIAGNOSIS — Z111 Encounter for screening for respiratory tuberculosis: Secondary | ICD-10-CM | POA: Diagnosis present

## 2022-12-26 ENCOUNTER — Encounter: Payer: Self-pay | Admitting: *Deleted

## 2022-12-26 ENCOUNTER — Other Ambulatory Visit: Payer: Self-pay | Admitting: *Deleted

## 2023-01-10 ENCOUNTER — Other Ambulatory Visit: Payer: Self-pay

## 2023-01-10 ENCOUNTER — Telehealth: Payer: Self-pay

## 2023-01-10 NOTE — Telephone Encounter (Signed)
Patient called stating that she reached out to the health department for TB treatment. She states that she was told that she needs a referral from our office for treatment. I am not able to find an ambulatory referral for the health department. Can you assist with this?

## 2023-02-07 ENCOUNTER — Other Ambulatory Visit (HOSPITAL_COMMUNITY)
Admission: RE | Admit: 2023-02-07 | Discharge: 2023-02-07 | Disposition: A | Discharge status: Home / Self Care | Payer: Medicaid Other | Source: Ambulatory Visit | Attending: Obstetrics & Gynecology | Admitting: Obstetrics & Gynecology

## 2023-02-07 ENCOUNTER — Other Ambulatory Visit (INDEPENDENT_AMBULATORY_CARE_PROVIDER_SITE_OTHER): Payer: Medicaid Other

## 2023-02-07 ENCOUNTER — Ambulatory Visit (INDEPENDENT_AMBULATORY_CARE_PROVIDER_SITE_OTHER): Payer: Medicaid Other | Admitting: *Deleted

## 2023-02-07 VITALS — BP 103/69 | HR 71 | Wt 144.0 lb

## 2023-02-07 DIAGNOSIS — Z3A11 11 weeks gestation of pregnancy: Secondary | ICD-10-CM | POA: Diagnosis not present

## 2023-02-07 DIAGNOSIS — Z3480 Encounter for supervision of other normal pregnancy, unspecified trimester: Secondary | ICD-10-CM | POA: Insufficient documentation

## 2023-02-07 DIAGNOSIS — O3680X0 Pregnancy with inconclusive fetal viability, not applicable or unspecified: Secondary | ICD-10-CM

## 2023-02-07 DIAGNOSIS — Z3481 Encounter for supervision of other normal pregnancy, first trimester: Secondary | ICD-10-CM

## 2023-02-07 DIAGNOSIS — Z1339 Encounter for screening examination for other mental health and behavioral disorders: Secondary | ICD-10-CM

## 2023-02-07 MED ORDER — BLOOD PRESSURE KIT DEVI: 1.0000 | 0 refills | Status: AC

## 2023-02-07 NOTE — Progress Notes (Signed)
New OB Intake  I connected with Shelby Orr  on 02/07/23 at 10:00 AM EDT by In Person Visit and verified that I am speaking with the correct person using two identifiers. Nurse is located at [facility] and pt is located at [place].  I discussed the limitations, risks, security and privacy concerns of performing an evaluation and management service by telephone and the availability of in person appointments. I also discussed with the patient that there may be a patient responsible charge related to this service. The patient expressed understanding and agreed to proceed.  I explained I am completing New OB Intake today. We discussed EDD of 08/24/2023, by Last Menstrual Period. Pt is [id number]. I reviewed her allergies, medications and Medical/Surgical/OB history.    There are no problems to display for this patient.   Concerns addressed today  Delivery Plans Plans to deliver at [facility]. Discussed the nature of our practice with multiple providers including residents and students. Due to the size of the practice, the delivering provider may not be the same as those providing prenatal care.   Patient is not interested in water birth. Offered upcoming OB visit with CNM to discuss further.  MyChart/Babyscripts MyChart access verified. I explained pt will have some visits in office and some virtually. Babyscripts instructions given and order placed. Patient verifies receipt of registration text/e-mail. Account successfully created and app downloaded.  Blood Pressure Cuff/Weight Scale Blood pressure cuff ordered for patient to pick-up from [employer]. Explained after first prenatal appt pt will check weekly and document in Babyscripts. Patient does not have weight scale; patient may purchase if they desire to track weight weekly in Babyscripts.  Anatomy US Explained first scheduled [place] will be around 19 weeks. Anatomy US scheduled for TBD at TBD.  Interested in [place]? If yes, send referral and  doula dot phrase.    First visit review I reviewed new OB appt with patient. Explained pt will be seen by Dorathy Daft, CNM at first visit. Discussed Avelina Laine genetic screening with patient. Requested Panorama and Horizon.. Routine prenatal labs  OB Urine, GC/CC collected today, unable to obtain OB Panel and Panorama today.    Last Pap Diagnosis  Date Value Ref Range Status  10/04/2022   Final   - Negative for intraepithelial lesion or malignancy (NILM)    Harrel Lemon, RN 02/07/2023  9:56 AM

## 2023-02-07 NOTE — Patient Instructions (Signed)
[facility] has a partnership with the [employer] [facility] to provide prenatal navigation for the most needed resources in our community. In order to see how we can help connect you to these resources we need consent to contact you. Please complete the very short consent using the link below:   English Link: [email or web address]  Spanish Link: [email or web address]  

## 2023-02-09 LAB — URINE CULTURE, OB REFLEX: Organism ID, Bacteria: NO GROWTH

## 2023-02-09 LAB — CULTURE, OB URINE

## 2023-02-10 LAB — CERVICOVAGINAL ANCILLARY ONLY
Chlamydia: NEGATIVE
Comment: NEGATIVE
Comment: NORMAL
Neisseria Gonorrhea: NEGATIVE

## 2023-02-21 ENCOUNTER — Ambulatory Visit: Payer: Medicaid Other | Admitting: Certified Nurse Midwife

## 2023-02-21 ENCOUNTER — Encounter: Payer: Self-pay | Admitting: Certified Nurse Midwife

## 2023-02-21 VITALS — BP 96/62 | HR 65 | Wt 145.2 lb

## 2023-02-21 DIAGNOSIS — Z31430 Encounter of female for testing for genetic disease carrier status for procreative management: Secondary | ICD-10-CM

## 2023-02-21 DIAGNOSIS — Z641 Problems related to multiparity: Secondary | ICD-10-CM

## 2023-02-21 DIAGNOSIS — Z3482 Encounter for supervision of other normal pregnancy, second trimester: Secondary | ICD-10-CM | POA: Diagnosis not present

## 2023-02-21 DIAGNOSIS — Z3480 Encounter for supervision of other normal pregnancy, unspecified trimester: Secondary | ICD-10-CM

## 2023-02-21 DIAGNOSIS — Z3A13 13 weeks gestation of pregnancy: Secondary | ICD-10-CM

## 2023-02-21 NOTE — Progress Notes (Signed)
History:   Shelby Orr is a 32 y.o. [id number] at [redacted]w[redacted]d by LMP being seen today for her first obstetrical visit.  Her obstetrical history is significant for  grand multip . Patient does intend to breast feed. Pregnancy history fully reviewed.  Patient reports no complaints.      HISTORY: OB History  Gravida Para Term Preterm AB Living  5 4 4  0 0 4  SAB IAB Ectopic Multiple Live Births  0 0 0 0 4    # Outcome Date GA Lbr Len/2nd Weight Sex Type Anes PTL Lv  5 Current           4 Term 11/14/19 [redacted]w[redacted]d / 00:03 7 lb 1.8 oz (3.225 kg) F Vag-Spont EPI  LIV     Birth Comments: WNL     Name: Goatley,GIRL Lorre     Apgar1: 8  Apgar5: 9  3 Term 2017 [redacted]w[redacted]d  6 lb 3 oz (2.807 kg) M Vag-Spont  N LIV  2 Term 2014 [redacted]w[redacted]d  7 lb 15 oz (3.6 kg) F Vag-Spont  N LIV  1 Term 2012 [redacted]w[redacted]d   M Vag-Spont  N LIV    Last pap smear was done 09/14/2022 and was normal  Past Medical History:  Diagnosis Date   Headache    Past Surgical History:  Procedure Laterality Date   NO PAST SURGERIES     Family History  Problem Relation Age of Onset   Stroke Mother    Hypertension Father    Diabetes Paternal Grandfather    Social History   Tobacco Use   Smoking status: Never   Smokeless tobacco: Never  Vaping Use   Vaping status: Never Used  Substance Use Topics   Alcohol use: No   Drug use: No   No Known Allergies Current Outpatient Medications on File Prior to Visit  Medication Sig Dispense Refill   Blood Pressure Monitoring (BLOOD PRESSURE KIT) DEVI 1 Device by Does not apply route once a week. 1 each 0   Prenat-Fe Poly-Methfol-FA-DHA (VITAFOL ULTRA) 29-0.6-0.4-200 MG CAPS Take 1 capsule by mouth daily. 30 capsule 11   No current facility-administered medications on file prior to visit.    Review of Systems Pertinent items noted in HPI and remainder of comprehensive ROS otherwise negative.  Indications for ASA therapy (per UpToDate) One of the following: Previous pregnancy with preeclampsia,  especially early onset and with an adverse outcome No Multifetal gestation No Chronic hypertension No Type 1 or 2 diabetes mellitus No Chronic kidney disease No Autoimmune disease (antiphospholipid syndrome, systemic lupus erythematosus) No Two or more of the following: Nulliparity No Obesity (body mass index >30 kg/m2) No Family history of preeclampsia in mother or sister No Age ?35 years No Sociodemographic characteristics (African American race, low socioeconomic level) Yes Personal risk factors (eg, previous pregnancy with low birth weight or small for gestational age infant, previous adverse pregnancy outcome [eg, stillbirth], interval >10 years between pregnancies) No  Indications for early GDM screening (FBS, A1C, Random CBG, GTT) First-degree relative with diabetes No BMI >30kg/m2 No Age > 35 No Previous birth of an infant weighing ?4000 g No Gestational diabetes mellitus in a previous pregnancy No Glycated hemoglobin ?5.7 percent (39 mmol/mol), impaired glucose tolerance, or impaired fasting glucose on previous testing No High-risk race/ethnicity (eg, African American, Latino, Native American, [place] American, Pacific Islander) Yes Previous stillbirth of unknown cause No Maternal birthweight > 9 lbs No History of cardiovascular disease No Hypertension or on therapy for hypertension No  High-density lipoprotein cholesterol level <35 mg/dL (0.98 mmol/L) and/or a triglyceride level >250 mg/dL (1.19 mmol/L) No Polycystic ovary syndrome No Physical inactivity No Other clinical condition associated with insulin resistance (eg, severe obesity, acanthosis nigricans) No Current use of glucocorticoids No   Physical Exam:   Vitals:   02/21/23 1026  BP: 96/62  Pulse: 65  Weight: 145 lb 3.2 oz (65.9 kg)   Fetal Heart Rate (bpm): 141   General: well-developed, well-nourished female in no acute distress  Skin: normal coloration and turgor, no rashes  Neurologic: oriented, normal,  negative, normal mood  Extremities: normal strength, tone, and muscle mass, ROM of all joints is normal  HEENT PERRLA, extraocular movement intact and sclera clear, anicteric  Neck supple and no masses  Cardiovascular: regular rate and rhythm  Respiratory:  no respiratory distress, normal breath sounds  Abdomen: soft, non-tender; bowel sounds normal; no masses,  no organomegaly    Assessment:    Pregnancy: [id number] Patient Active Problem List   Diagnosis Date Noted   Supervision of other normal pregnancy, antepartum 02/07/2023     Plan:    1. Supervision of other normal pregnancy, antepartum - Patient doing well.  - CBC/D/Plt+RPR+Rh+ABO+RubIgG... - Comp Met (CMET)  2. [redacted] weeks gestation of pregnancy - Patient desiring to move back to [place] in August and wants to have all of her testing completed prior to leaving.  - Reviewed that some testing is recommended at specific gestational ages.  - Patient verbalized understanding.  - CBC/D/Plt+RPR+Rh+ABO+RubIgG... - Comp Met (CMET)  3. Encounter for supervision of other normal pregnancy in second trimester - PANORAMA PRENATAL TEST  4. Encounter of female for testing for genetic disease carrier status for procreative management - [facility] Basic Panel  5. Grand multiparity - Smallest Vaginal delivery 6#  - Largest Vaginal delivery 8# 10 oz.    Initial labs drawn. Continue prenatal vitamins. Problem list reviewed and updated. Genetic Screening discussed, Panorama and Horizon: ordered. Ultrasound discussed; fetal anatomic survey: scheduled. Anticipatory guidance about prenatal visits given including labs, ultrasounds, and testing. Weight gain recommendations per IOM guidelines reviewed: underweight/BMI 18.5 or less > 28 - 40 lbs; normal weight/BMI 18.5 - 24.9 > 25 - 35 lbs; overweight/BMI 25 - 29.9 > 15 - 25 lbs; obese/BMI  30 or more > 11 - 20 lbs. Discussed usage of the Babyscripts app for more information about pregnancy, and  to track blood pressures. Also discussed usage of virtual visits as additional source of managing and completing prenatal visits.  Patient was encouraged to use MyChart to review results, send requests, and have questions addressed.   The nature of [facility] with multiple MDs and Advanced Practice Providers was explained to patient; also emphasized that residents, students are part of our team. Routine obstetric precautions reviewed. Encouraged to seek out care at our office or emergency room [facility] preferred) for urgent and/or emergent concerns. Return in about 4 weeks (around 03/21/2023) for LOB.     Pietra Zuluaga Danella Deis) Suzie Portela, MSN, CNM  [facility]  02/21/2023 10:38 PM

## 2023-02-22 LAB — CBC/D/PLT+RPR+RH+ABO+RUBIGG...
Antibody Screen: NEGATIVE
Basophils Absolute: 0 10*3/uL (ref 0.0–0.2)
Basos: 0 %
EOS (ABSOLUTE): 0.1 10*3/uL (ref 0.0–0.4)
Eos: 1 %
HCV Ab: NONREACTIVE
HIV Screen 4th Generation wRfx: NONREACTIVE
Hematocrit: 35.7 % (ref 34.0–46.6)
Hemoglobin: 10.9 g/dL — ABNORMAL LOW (ref 11.1–15.9)
Hepatitis B Surface Ag: NEGATIVE
Immature Grans (Abs): 0.1 10*3/uL (ref 0.0–0.1)
Immature Granulocytes: 1 %
Lymphocytes Absolute: 1.9 10*3/uL (ref 0.7–3.1)
Lymphs: 17 %
MCH: 27 pg (ref 26.6–33.0)
MCHC: 30.5 g/dL — ABNORMAL LOW (ref 31.5–35.7)
MCV: 88 fL (ref 79–97)
Monocytes Absolute: 0.8 10*3/uL (ref 0.1–0.9)
Monocytes: 7 %
Neutrophils Absolute: 8.1 10*3/uL — ABNORMAL HIGH (ref 1.4–7.0)
Neutrophils: 74 %
Platelets: 309 10*3/uL (ref 150–450)
RBC: 4.04 x10E6/uL (ref 3.77–5.28)
RDW: 11.2 % — ABNORMAL LOW (ref 11.7–15.4)
RPR Ser Ql: NONREACTIVE
Rh Factor: POSITIVE
Rubella Antibodies, IGG: 13.4 index (ref >0.99)
WBC: 10.9 10*3/uL — ABNORMAL HIGH (ref 3.4–10.8)

## 2023-02-22 LAB — COMPREHENSIVE METABOLIC PANEL
ALT: 35 IU/L — ABNORMAL HIGH (ref 0–32)
AST: 25 IU/L (ref 0–40)
Albumin: 4.1 g/dL (ref 3.9–4.9)
Alkaline Phosphatase: 89 IU/L (ref 44–121)
BUN/Creatinine Ratio: 10 (ref 9–23)
BUN: 6 mg/dL (ref 6–20)
Bilirubin Total: 0.3 mg/dL (ref 0.0–1.2)
CO2: 20 mmol/L (ref 20–29)
Calcium: 9.4 mg/dL (ref 8.7–10.2)
Chloride: 102 mmol/L (ref 96–106)
Creatinine, Ser: 0.62 mg/dL (ref 0.57–1.00)
Globulin, Total: 2.8 g/dL (ref 1.5–4.5)
Glucose: 87 mg/dL (ref 70–99)
Potassium: 3.8 mmol/L (ref 3.5–5.2)
Sodium: 135 mmol/L (ref 134–144)
Total Protein: 6.9 g/dL (ref 6.0–8.5)
eGFR: 120 mL/min/{1.73_m2} (ref >59)

## 2023-02-22 LAB — HCV INTERPRETATION

## 2023-02-27 LAB — HORIZON CUSTOM

## 2023-03-05 LAB — PANORAMA PRENATAL TEST FULL PANEL:PANORAMA TEST PLUS 5 ADDITIONAL MICRODELETIONS: FETAL FRACTION: 7.6

## 2023-03-20 ENCOUNTER — Encounter: Payer: Medicaid Other | Admitting: Family Medicine

## 2023-03-26 DIAGNOSIS — O0940 Supervision of pregnancy with grand multiparity, unspecified trimester: Secondary | ICD-10-CM | POA: Insufficient documentation

## 2023-03-26 DIAGNOSIS — A150 Tuberculosis of lung: Secondary | ICD-10-CM | POA: Insufficient documentation

## 2023-03-26 DIAGNOSIS — D563 Thalassemia minor: Secondary | ICD-10-CM | POA: Insufficient documentation

## 2023-03-31 ENCOUNTER — Ambulatory Visit: Payer: Medicaid Other | Attending: Obstetrics & Gynecology

## 2023-03-31 ENCOUNTER — Ambulatory Visit: Payer: Medicaid Other

## 2023-03-31 DIAGNOSIS — O0942 Supervision of pregnancy with grand multiparity, second trimester: Secondary | ICD-10-CM

## 2023-03-31 DIAGNOSIS — D563 Thalassemia minor: Secondary | ICD-10-CM

## 2023-03-31 DIAGNOSIS — A150 Tuberculosis of lung: Secondary | ICD-10-CM

## 2023-04-16 ENCOUNTER — Encounter: Payer: Medicaid Other | Admitting: Obstetrics and Gynecology
